# Patient Record
Sex: Female | Born: 1985 | Race: Black or African American | Hispanic: No | Marital: Single | Smoking: Former smoker
Health system: Southern US, Community
[De-identification: ages and names within clinical notes are randomized; demographics above are authoritative.]

## PROBLEM LIST (undated history)

## (undated) ENCOUNTER — Inpatient Hospital Stay (HOSPITAL_COMMUNITY): Payer: Self-pay

## (undated) DIAGNOSIS — B999 Unspecified infectious disease: Secondary | ICD-10-CM

## (undated) DIAGNOSIS — D649 Anemia, unspecified: Secondary | ICD-10-CM

## (undated) DIAGNOSIS — Z789 Other specified health status: Secondary | ICD-10-CM

## (undated) DIAGNOSIS — IMO0002 Reserved for concepts with insufficient information to code with codable children: Secondary | ICD-10-CM

## (undated) DIAGNOSIS — A749 Chlamydial infection, unspecified: Secondary | ICD-10-CM

## (undated) DIAGNOSIS — I10 Essential (primary) hypertension: Secondary | ICD-10-CM

## (undated) HISTORY — PX: NO PAST SURGERIES: SHX2092

## (undated) HISTORY — DX: Chlamydial infection, unspecified: A74.9

## (undated) HISTORY — DX: Essential (primary) hypertension: I10

## (undated) HISTORY — DX: Reserved for concepts with insufficient information to code with codable children: IMO0002

## (undated) HISTORY — DX: Anemia, unspecified: D64.9

## (undated) HISTORY — DX: Unspecified infectious disease: B99.9

---
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
## 2004-09-26 ENCOUNTER — Emergency Department (HOSPITAL_COMMUNITY): Admission: EM | Admit: 2004-09-26 | Discharge: 2004-09-26 | Payer: Self-pay | Admitting: Family Medicine

## 2006-07-13 ENCOUNTER — Inpatient Hospital Stay (HOSPITAL_COMMUNITY): Admission: AD | Admit: 2006-07-13 | Discharge: 2006-07-13 | Payer: Self-pay | Admitting: Obstetrics & Gynecology

## 2006-07-17 ENCOUNTER — Inpatient Hospital Stay (HOSPITAL_COMMUNITY): Admission: AD | Admit: 2006-07-17 | Discharge: 2006-07-17 | Payer: Self-pay | Admitting: Obstetrics and Gynecology

## 2006-10-03 ENCOUNTER — Ambulatory Visit (HOSPITAL_COMMUNITY): Admission: RE | Admit: 2006-10-03 | Discharge: 2006-10-03 | Payer: Self-pay | Admitting: Obstetrics & Gynecology

## 2007-01-06 ENCOUNTER — Ambulatory Visit: Payer: Self-pay | Admitting: Physician Assistant

## 2007-01-06 ENCOUNTER — Inpatient Hospital Stay (HOSPITAL_COMMUNITY): Admission: AD | Admit: 2007-01-06 | Discharge: 2007-01-06 | Payer: Self-pay | Admitting: Gynecology

## 2008-04-29 ENCOUNTER — Inpatient Hospital Stay (HOSPITAL_COMMUNITY): Admission: AD | Admit: 2008-04-29 | Discharge: 2008-04-29 | Payer: Self-pay | Admitting: Family Medicine

## 2009-06-27 ENCOUNTER — Emergency Department (HOSPITAL_COMMUNITY): Admission: EM | Admit: 2009-06-27 | Discharge: 2009-06-27 | Payer: Self-pay | Admitting: Emergency Medicine

## 2011-02-22 ENCOUNTER — Inpatient Hospital Stay (INDEPENDENT_AMBULATORY_CARE_PROVIDER_SITE_OTHER)
Admission: RE | Admit: 2011-02-22 | Discharge: 2011-02-22 | Disposition: A | Discharge status: Home / Self Care | Payer: Self-pay | Source: Ambulatory Visit | Attending: Family Medicine | Admitting: Family Medicine

## 2011-02-22 DIAGNOSIS — M79609 Pain in unspecified limb: Secondary | ICD-10-CM

## 2011-02-22 LAB — POCT PREGNANCY, URINE: Preg Test, Ur: NEGATIVE

## 2011-03-19 LAB — URINALYSIS, ROUTINE W REFLEX MICROSCOPIC
Bilirubin Urine: NEGATIVE
Ketones, ur: NEGATIVE
Nitrite: NEGATIVE
Protein, ur: NEGATIVE
Urobilinogen, UA: 1
pH: 7.5

## 2011-03-19 LAB — URINE MICROSCOPIC-ADD ON

## 2011-03-19 LAB — POCT PREGNANCY, URINE

## 2011-04-01 LAB — URINALYSIS, ROUTINE W REFLEX MICROSCOPIC
Bilirubin Urine: NEGATIVE
Glucose, UA: NEGATIVE
Nitrite: NEGATIVE
Specific Gravity, Urine: 1.02
pH: 7

## 2011-04-01 LAB — URINE MICROSCOPIC-ADD ON

## 2011-04-01 LAB — WET PREP, GENITAL

## 2011-04-01 LAB — GC/CHLAMYDIA PROBE AMP, GENITAL

## 2012-02-04 ENCOUNTER — Inpatient Hospital Stay (HOSPITAL_COMMUNITY)
Admission: AD | Admit: 2012-02-04 | Discharge: 2012-02-04 | Disposition: A | Discharge status: Home / Self Care | Payer: Self-pay | Source: Ambulatory Visit | Attending: Obstetrics & Gynecology | Admitting: Obstetrics & Gynecology

## 2012-02-04 ENCOUNTER — Emergency Department (HOSPITAL_COMMUNITY)
Admission: EM | Admit: 2012-02-04 | Discharge: 2012-02-04 | Discharge status: Absent without leave | Payer: Self-pay | Source: Home / Self Care

## 2012-02-04 ENCOUNTER — Encounter (HOSPITAL_COMMUNITY): Payer: Self-pay

## 2012-02-04 DIAGNOSIS — Z331 Pregnant state, incidental: Secondary | ICD-10-CM

## 2012-02-04 DIAGNOSIS — Z3202 Encounter for pregnancy test, result negative: Secondary | ICD-10-CM | POA: Insufficient documentation

## 2012-02-04 DIAGNOSIS — Z3490 Encounter for supervision of normal pregnancy, unspecified, unspecified trimester: Secondary | ICD-10-CM

## 2012-02-04 HISTORY — DX: Other specified health status: Z78.9

## 2012-02-04 LAB — POCT PREGNANCY, URINE: Preg Test, Ur: POSITIVE — AB

## 2012-02-04 MED ORDER — PRENATAL PLUS 27-1 MG PO TABS: 1.0000 | ORAL_TABLET | Freq: Every day | ORAL | Status: DC

## 2012-02-04 NOTE — MAU Provider Note (Signed)
Attestation of Attending Supervision of Advanced Practitioner (CNM/NP): Evaluation and management procedures were performed by the Advanced Practitioner under my supervision and collaboration.  I have reviewed the Advanced Practitioner's note and chart, and I agree with the management and plan.  Jaynie Collins, MD, FACOG Attending Obstetrician & Gynecologist Faculty Practice, [facility] [place], [facility]

## 2012-02-04 NOTE — MAU Provider Note (Signed)
Helen Tyler [id number] @[redacted]w[redacted]d  by LMP Chief Complaint  Patient presents with  . Possible Pregnancy    First contact 1650    SUBJECTIVE  HPI: Wants pregnancy confirmation. +HPT. LMP sure. No bleeding or abd pain.   Past Medical History  Diagnosis Date  . No pertinent past medical history    Past Surgical History  Procedure Date  . No past surgeries    History   Social History  . Marital Status: Single    Spouse Name: N/A    Number of Children: N/A  . Years of Education: N/A   Occupational History  . Not on file.   Social History Main Topics  . Smoking status: Current Some Day Smoker  . Smokeless tobacco: Not on file  . Alcohol Use: No  . Drug Use: Yes    Special: Marijuana  . Sexually Active: Yes   Other Topics Concern  . Not on file   Social History Narrative  . No narrative on file   No current facility-administered medications on file prior to encounter.   No current outpatient prescriptions on file prior to encounter.   Allergies not on file  ROS: Pertinent items in HPI  OBJECTIVE Blood pressure 120/68, pulse 84, temperature 99.9 F (37.7 C), temperature source Oral, resp. rate 16, height 5' 5.5" (1.664 m), weight 61.145 kg (134 lb 12.8 oz), last menstrual period 12/14/2011, SpO2 100.00%.  GENERAL: Well-developed, well-nourished female in no acute distress.  HEENT: Normocephalic, good dentition HEART: normal rate RESP: normal effort ABDOMEN: Soft, nontender EXTREMITIES: Nontender, no edema NEURO: Alert and oriented   LAB RESULTS  Results for orders placed during the hospital encounter of 02/04/12 (from the past 24 hour(s))  POCT PREGNANCY, URINE     Status: Abnormal   Collection Time   02/04/12  4:29 PM      Component Value Range   Preg Test, Ur POSITIVE (*) NEGATIVE       ASSESSMENT  1. Pregnancy     PLAN  Medication List  As of 02/04/2012  4:53 PM   TAKE these medications         prenatal vitamin w/FE, FA 27-1 MG Tabs     Take 1 tablet by mouth daily.           Pregnancy precautions, Rx prenatal vitamins, pregnancy verification letter     [facility] 02/04/2012 4:53 PM

## 2012-02-04 NOTE — MAU Note (Signed)
Patient states she had a positive home pregnancy test last night. Wants to have confirmation. Denies any pain or bleeding.

## 2012-02-12 ENCOUNTER — Inpatient Hospital Stay (HOSPITAL_COMMUNITY)
Admission: AD | Admit: 2012-02-12 | Discharge: 2012-02-12 | Disposition: A | Discharge status: Home / Self Care | Payer: Medicaid Other | Source: Ambulatory Visit | Attending: Obstetrics & Gynecology | Admitting: Obstetrics & Gynecology

## 2012-02-12 ENCOUNTER — Inpatient Hospital Stay (HOSPITAL_COMMUNITY): Payer: Medicaid Other

## 2012-02-12 ENCOUNTER — Encounter (HOSPITAL_COMMUNITY): Payer: Self-pay

## 2012-02-12 DIAGNOSIS — R1031 Right lower quadrant pain: Secondary | ICD-10-CM | POA: Insufficient documentation

## 2012-02-12 DIAGNOSIS — Z3490 Encounter for supervision of normal pregnancy, unspecified, unspecified trimester: Secondary | ICD-10-CM

## 2012-02-12 DIAGNOSIS — O99891 Other specified diseases and conditions complicating pregnancy: Secondary | ICD-10-CM | POA: Insufficient documentation

## 2012-02-12 DIAGNOSIS — Z331 Pregnant state, incidental: Secondary | ICD-10-CM

## 2012-02-12 LAB — CBC
HCT: 33 % — ABNORMAL LOW (ref 36.0–46.0)
Hemoglobin: 11 g/dL — ABNORMAL LOW (ref 12.0–15.0)
MCH: 27.2 pg (ref 26.0–34.0)
MCHC: 33.3 g/dL (ref 30.0–36.0)

## 2012-02-12 LAB — WET PREP, GENITAL
Trich, Wet Prep: NONE SEEN
Yeast Wet Prep HPF POC: NONE SEEN

## 2012-02-12 LAB — URINALYSIS, ROUTINE W REFLEX MICROSCOPIC
Bilirubin Urine: NEGATIVE
Glucose, UA: NEGATIVE mg/dL
Ketones, ur: NEGATIVE mg/dL
Leukocytes, UA: NEGATIVE
Nitrite: NEGATIVE
Protein, ur: NEGATIVE mg/dL
Specific Gravity, Urine: 1.025 (ref 1.005–1.030)
Urobilinogen, UA: 2 mg/dL — ABNORMAL HIGH (ref 0.0–1.0)
pH: 6.5 (ref 5.0–8.0)

## 2012-02-12 LAB — URINE MICROSCOPIC-ADD ON

## 2012-02-12 NOTE — MAU Note (Signed)
Got bad last night, keep having sharp pain in bottom of stomach- mainly on right side- but it moves- back and forth.

## 2012-02-12 NOTE — MAU Provider Note (Signed)
History     CSN: [id number]  Arrival date and time: 02/12/12 1044   None     Chief Complaint  Patient presents with  . Abdominal Pain   HPI This is a 26 y.o. female at [redacted]w[redacted]d who presents with c/o RLQ pain and bleeding when she wipes. Denies fever or Nausea, but reports decreased appetite.  OB History    Grav Para Term Preterm Abortions TAB SAB Ect Mult Living   4 1 1  2 2    1       Past Medical History  Diagnosis Date  . No pertinent past medical history     Past Surgical History  Procedure Date  . No past surgeries     Family History  Problem Relation Age of Onset  . Other Neg Hx   . Hypertension Mother     History  Substance Use Topics  . Smoking status: Former Games developer  . Smokeless tobacco: Not on file  . Alcohol Use: No    Allergies: No Known Allergies  No prescriptions prior to admission    ROS See HPI  Physical Exam   Blood pressure 114/65, pulse 73, temperature 99.4 F (37.4 C), temperature source Oral, resp. rate 18, height 5\' 5"  (1.651 m), weight 132 lb (59.875 kg), last menstrual period 12/14/2011.  Physical Exam  Constitutional: She is oriented to person, place, and time. She appears well-developed and well-nourished. No distress.  Cardiovascular: Normal rate.   Respiratory: Effort normal.  GI: Soft. She exhibits no distension and no mass. There is tenderness (Mild, RLQ). There is no rebound and no guarding.  Genitourinary: Vagina normal and uterus normal. No vaginal discharge (No blood in vault) found.  Musculoskeletal: Normal range of motion.  Neurological: She is alert and oriented to person, place, and time.  Skin: Skin is warm and dry.  Psychiatric: She has a normal mood and affect.   Results for orders placed during the hospital encounter of 02/12/12 (from the past 24 hour(s))  URINALYSIS, ROUTINE W REFLEX MICROSCOPIC     Status: Abnormal   Collection Time   02/12/12 11:10 AM      Component Value Range   Color, Urine YELLOW   YELLOW   APPearance CLEAR  CLEAR   Specific Gravity, Urine 1.025  1.005 - 1.030   pH 6.5  5.0 - 8.0   Glucose, UA NEGATIVE  NEGATIVE mg/dL   Hgb urine dipstick TRACE (*) NEGATIVE   Bilirubin Urine NEGATIVE  NEGATIVE   Ketones, ur NEGATIVE  NEGATIVE mg/dL   Protein, ur NEGATIVE  NEGATIVE mg/dL   Urobilinogen, UA 2.0 (*) 0.0 - 1.0 mg/dL   Nitrite NEGATIVE  NEGATIVE   Leukocytes, UA NEGATIVE  NEGATIVE  URINE MICROSCOPIC-ADD ON     Status: Abnormal   Collection Time   02/12/12 11:10 AM      Component Value Range   Squamous Epithelial / LPF RARE  RARE   WBC, UA 0-2  <3 WBC/hpf   RBC / HPF 0-2  <3 RBC/hpf   Bacteria, UA FEW (*) RARE   Urine-Other MUCOUS PRESENT    WET PREP, GENITAL     Status: Abnormal   Collection Time   02/12/12 11:20 AM      Component Value Range   Yeast Wet Prep HPF POC NONE SEEN  NONE SEEN   Trich, Wet Prep NONE SEEN  NONE SEEN   Clue Cells Wet Prep HPF POC FEW (*) NONE SEEN   WBC, Wet Prep HPF POC  FEW (*) NONE SEEN  HCG, QUANTITATIVE, PREGNANCY     Status: Abnormal   Collection Time   02/12/12 11:40 AM      Component Value Range   hCG, Beta Chain, Sharene Butters, [place] [address] (*) <5 mIU/mL  CBC     Status: Abnormal   Collection Time   02/12/12 11:40 AM      Component Value Range   WBC 5.3  4.0 - 10.5 K/uL   RBC 4.04  3.87 - 5.11 MIL/uL   Hemoglobin 11.0 (*) 12.0 - 15.0 g/dL   HCT 60.4 (*) 54.0 - 98.1 %   MCV 81.7  78.0 - 100.0 fL   MCH 27.2  26.0 - 34.0 pg   MCHC 33.3  30.0 - 36.0 g/dL   RDW 19.1  47.8 - 29.5 %   Platelets 226  150 - 400 K/uL    MAU Course  Procedures  MDM Will check cultures, Quant and [place]. >> EDC 10/08/12 at 5.6 weeks with Yolk Sac seen. No embryo seen.   Assessment and Plan  A:  SIUP at [redacted]w[redacted]d by LMP, 5.6wks by [place]      Yolk sac seen  P:  Discharge      Start prenatal care  [facility] 02/12/2012, 11:35 AM

## 2012-02-12 NOTE — MAU Provider Note (Signed)
Attestation of Attending Supervision of Advanced Practitioner (CNM/NP): Evaluation and management procedures were performed by the Advanced Practitioner under my supervision and collaboration.  I have reviewed the Advanced Practitioner's note and chart, and I agree with the management and plan.  Tip Atienza, MD, FACOG Attending Obstetrician & Gynecologist Faculty Practice, [facility]  

## 2012-02-20 ENCOUNTER — Encounter (HOSPITAL_COMMUNITY): Payer: Self-pay | Admitting: *Deleted

## 2012-02-20 ENCOUNTER — Inpatient Hospital Stay (HOSPITAL_COMMUNITY)
Admission: AD | Admit: 2012-02-20 | Discharge: 2012-02-20 | Disposition: A | Discharge status: Home / Self Care | Payer: Medicaid Other | Source: Ambulatory Visit | Attending: Obstetrics & Gynecology | Admitting: Obstetrics & Gynecology

## 2012-02-20 DIAGNOSIS — R109 Unspecified abdominal pain: Secondary | ICD-10-CM | POA: Insufficient documentation

## 2012-02-20 DIAGNOSIS — O210 Mild hyperemesis gravidarum: Secondary | ICD-10-CM | POA: Insufficient documentation

## 2012-02-20 DIAGNOSIS — A499 Bacterial infection, unspecified: Secondary | ICD-10-CM | POA: Insufficient documentation

## 2012-02-20 DIAGNOSIS — B9689 Other specified bacterial agents as the cause of diseases classified elsewhere: Secondary | ICD-10-CM | POA: Insufficient documentation

## 2012-02-20 DIAGNOSIS — N760 Acute vaginitis: Secondary | ICD-10-CM | POA: Insufficient documentation

## 2012-02-20 DIAGNOSIS — O2390 Unspecified genitourinary tract infection in pregnancy, unspecified trimester: Secondary | ICD-10-CM | POA: Insufficient documentation

## 2012-02-20 DIAGNOSIS — O219 Vomiting of pregnancy, unspecified: Secondary | ICD-10-CM

## 2012-02-20 LAB — WET PREP, GENITAL: Yeast Wet Prep HPF POC: NONE SEEN

## 2012-02-20 LAB — URINALYSIS, ROUTINE W REFLEX MICROSCOPIC
Leukocytes, UA: NEGATIVE
Nitrite: NEGATIVE
Specific Gravity, Urine: 1.025 (ref 1.005–1.030)
pH: 7 (ref 5.0–8.0)

## 2012-02-20 LAB — URINE MICROSCOPIC-ADD ON

## 2012-02-20 MED ORDER — PROMETHAZINE HCL 25 MG PO TABS: 25.0000 mg | ORAL_TABLET | Freq: Four times a day (QID) | ORAL | Status: DC | PRN

## 2012-02-20 MED ORDER — ONDANSETRON 8 MG PO TBDP: 8.0000 mg | ORAL_TABLET | Freq: Three times a day (TID) | ORAL | Status: AC | PRN

## 2012-02-20 MED ORDER — ONDANSETRON 8 MG PO TBDP
8.0000 mg | ORAL_TABLET | Freq: Once | ORAL | Status: AC
  Administered 2012-02-20: 8 mg via ORAL
  Filled 2012-02-20: qty 1

## 2012-02-20 MED ORDER — DOCUSATE SODIUM 100 MG PO CAPS: 100.0000 mg | ORAL_CAPSULE | Freq: Two times a day (BID) | ORAL | Status: AC

## 2012-02-20 NOTE — MAU Provider Note (Signed)
History     CSN: [id number]  Arrival date and time: 02/20/12 1451   First Provider Initiated Contact with Patient 02/20/12 1633      Chief Complaint  Patient presents with  . Emesis During Pregnancy  . Vaginal Discharge   HPI Pt is 7w pregnant and complains of nausea and vomiting with pregnancy, unable to keep any food or fluids down for 24 hours except for oatmeal this morning.  Pt was just seen on 8/29 with wet prep and GC/chlamydia and confirmation of pregnancy, with abdominal pain, which has resolved. Past Medical History  Diagnosis Date  . No pertinent past medical history     Past Surgical History  Procedure Date  . No past surgeries     Family History  Problem Relation Age of Onset  . Other Neg Hx   . Hypertension Mother     History  Substance Use Topics  . Smoking status: Former Games developer  . Smokeless tobacco: Not on file  . Alcohol Use: No    Allergies: No Known Allergies  Prescriptions prior to admission  Medication Sig Dispense Refill  . acetaminophen (TYLENOL) 500 MG tablet Take 500 mg by mouth every 6 (six) hours as needed. For pain or cold symptoms        Review of Systems  Gastrointestinal: Positive for nausea, vomiting and constipation. Negative for abdominal pain and diarrhea.  Genitourinary: Negative for dysuria, urgency and frequency.   Physical Exam   Blood pressure 121/73, pulse 77, temperature 99.3 F (37.4 C), temperature source Oral, resp. rate 16, height 5\' 5"  (1.651 m), weight 59.24 kg (130 lb 9.6 oz), last menstrual period 12/14/2011, SpO2 100.00%.  Physical Exam  Vitals reviewed. Constitutional: She is oriented to person, place, and time. She appears well-developed and well-nourished.  HENT:  Head: Normocephalic.  Eyes: Pupils are equal, round, and reactive to light.  Neck: Normal range of motion. Neck supple.  Cardiovascular: Normal rate.   Respiratory: Effort normal.  GI: Soft. She exhibits no distension. There is no  tenderness. There is no rebound and no guarding.  Genitourinary:       Blind swab for wet prep- pt was just here for exam  Musculoskeletal: Normal range of motion.  Neurological: She is alert and oriented to person, place, and time.  Skin: Skin is warm and dry.  Psychiatric: She has a normal mood and affect.    MAU Course  Procedures Results for orders placed during the hospital encounter of 02/20/12 (from the past 24 hour(s))  URINALYSIS, ROUTINE W REFLEX MICROSCOPIC     Status: Abnormal   Collection Time   02/20/12  3:31 PM      Component Value Range   Color, Urine YELLOW  YELLOW   APPearance HAZY (*) CLEAR   Specific Gravity, Urine 1.025  1.005 - 1.030   pH 7.0  5.0 - 8.0   Glucose, UA NEGATIVE  NEGATIVE mg/dL   Hgb urine dipstick TRACE (*) NEGATIVE   Bilirubin Urine NEGATIVE  NEGATIVE   Ketones, ur NEGATIVE  NEGATIVE mg/dL   Protein, ur NEGATIVE  NEGATIVE mg/dL   Urobilinogen, UA 1.0  0.0 - 1.0 mg/dL   Nitrite NEGATIVE  NEGATIVE   Leukocytes, UA NEGATIVE  NEGATIVE  URINE MICROSCOPIC-ADD ON     Status: Abnormal   Collection Time   02/20/12  3:31 PM      Component Value Range   Squamous Epithelial / LPF FEW (*) RARE   WBC, UA 0-2  <3 WBC/hpf  RBC / HPF 3-6  <3 RBC/hpf   Bacteria, UA FEW (*) RARE   Urine-Other MUCOUS PRESENT    WET PREP, GENITAL     Status: Abnormal   Collection Time   02/20/12  5:05 PM      Component Value Range   Yeast Wet Prep HPF POC NONE SEEN  NONE SEEN   Trich, Wet Prep NONE SEEN  NONE SEEN   Clue Cells Wet Prep HPF POC FEW (*) NONE SEEN   WBC, Wet Prep HPF POC FEW (*) NONE SEEN  Zofran 8mg  ODT given for nausea and pt able to tolerated oral fluids  Assessment and Plan  Nausea and vomiting in pregnancy- prescription for Zofran 8mg  ODT and phenergan 25mg  Bacterial vaginitis- prescription for Flagyl 500mg  BID for 7 days F/u with OB care  Helen Tyler 02/20/2012, 5:57 PM

## 2012-02-20 NOTE — MAU Note (Signed)
Patient states she has had nausea and vomiting for about 4-5 days, not able to keep anything down. Has a clear vaginal discharge with odor. No bleeding or pain.

## 2012-03-04 ENCOUNTER — Inpatient Hospital Stay (HOSPITAL_COMMUNITY)
Admission: AD | Admit: 2012-03-04 | Discharge: 2012-03-04 | Disposition: A | Discharge status: Home / Self Care | Payer: Medicaid Other | Source: Ambulatory Visit | Attending: Obstetrics & Gynecology | Admitting: Obstetrics & Gynecology

## 2012-03-04 ENCOUNTER — Encounter (HOSPITAL_COMMUNITY): Payer: Self-pay | Admitting: *Deleted

## 2012-03-04 DIAGNOSIS — R109 Unspecified abdominal pain: Secondary | ICD-10-CM | POA: Insufficient documentation

## 2012-03-04 DIAGNOSIS — N949 Unspecified condition associated with female genital organs and menstrual cycle: Secondary | ICD-10-CM | POA: Insufficient documentation

## 2012-03-04 DIAGNOSIS — A499 Bacterial infection, unspecified: Secondary | ICD-10-CM

## 2012-03-04 DIAGNOSIS — O2390 Unspecified genitourinary tract infection in pregnancy, unspecified trimester: Secondary | ICD-10-CM | POA: Insufficient documentation

## 2012-03-04 DIAGNOSIS — B9689 Other specified bacterial agents as the cause of diseases classified elsewhere: Secondary | ICD-10-CM | POA: Insufficient documentation

## 2012-03-04 DIAGNOSIS — Z3490 Encounter for supervision of normal pregnancy, unspecified, unspecified trimester: Secondary | ICD-10-CM

## 2012-03-04 DIAGNOSIS — N760 Acute vaginitis: Secondary | ICD-10-CM

## 2012-03-04 LAB — URINALYSIS, ROUTINE W REFLEX MICROSCOPIC
Bilirubin Urine: NEGATIVE
Glucose, UA: NEGATIVE mg/dL
Ketones, ur: NEGATIVE mg/dL
Nitrite: NEGATIVE
Specific Gravity, Urine: >1.03 — ABNORMAL HIGH (ref 1.005–1.030)
pH: 6 (ref 5.0–8.0)

## 2012-03-04 LAB — WET PREP, GENITAL: Trich, Wet Prep: NONE SEEN

## 2012-03-04 LAB — URINE MICROSCOPIC-ADD ON

## 2012-03-04 MED ORDER — METRONIDAZOLE 500 MG PO TABS: 500.0000 mg | ORAL_TABLET | Freq: Two times a day (BID) | ORAL | Status: DC

## 2012-03-04 MED ORDER — PROMETHAZINE HCL 25 MG PO TABS: 25.0000 mg | ORAL_TABLET | Freq: Four times a day (QID) | ORAL | Status: DC | PRN

## 2012-03-04 MED ORDER — PRENATAL VITAMINS (DIS) PO TABS: 1.0000 | ORAL_TABLET | Freq: Every day | ORAL | Status: DC

## 2012-03-04 NOTE — MAU Provider Note (Signed)
Chief Complaint:  Vaginal Discharge and Abdominal Pain    First Provider Initiated Contact with Patient 03/04/12 1529      Helen Tyler is  26 y.o. [id number].  Patient's last menstrual period was 12/14/2011.Marland Kitchen  Her pregnancy status is positive.[redacted]w[redacted]d by LMP  She presents complaining of Vaginal Discharge and Abdominal Pain . Onset is described as ongoing and has been present for  1 weeks. Reports intermittent lower abd cramping. Was told she has a mild case of BV at last visit but was not treated. States vomiting improving, none in 3 days.  Obstetrical/Gynecological History: OB History    Grav Para Term Preterm Abortions TAB SAB Ect Mult Living   4 1 1  2 2    1       Past Medical History: Past Medical History  Diagnosis Date  . No pertinent past medical history     Past Surgical History: Past Surgical History  Procedure Date  . No past surgeries     Family History: Family History  Problem Relation Age of Onset  . Other Neg Hx   . Hypertension Mother     Social History: History  Substance Use Topics  . Smoking status: Former Games developer  . Smokeless tobacco: Not on file  . Alcohol Use: No    Allergies: No Known Allergies  Prescriptions prior to admission  Medication Sig Dispense Refill  . acetaminophen (TYLENOL) 500 MG tablet Take 500 mg by mouth every 6 (six) hours as needed. For pain or cold symptoms      . promethazine (PHENERGAN) 25 MG tablet Take 1 tablet (25 mg total) by mouth every 6 (six) hours as needed for nausea.  30 tablet  0    Review of Systems - History obtained from chart review and the patient General ROS: negative for - chills, fatigue or fever Breast ROS: positive for - tenderness Respiratory ROS: no cough, shortness of breath, or wheezing Cardiovascular ROS: no chest pain or dyspnea on exertion Gastrointestinal ROS: positive for - abd cramping negative for - diarrhea or nausea/vomiting Genito-Urinary ROS: no dysuria, trouble voiding, or  hematuria positive for - genital discharge and vulvar/vaginal symptoms  Physical Exam   Blood pressure 126/79, pulse 81, temperature 99.3 F (37.4 C), temperature source Oral, resp. rate 16, height 5\' 5"  (1.651 m), weight 126 lb 12.8 oz (57.516 kg), last menstrual period 12/14/2011, SpO2 100.00%.  General: General appearance - alert, well appearing, and in no distress, oriented to person, place, and time and normal appearing weight Mental status - alert, oriented to person, place, and time, normal mood, behavior, speech, dress, motor activity, and thought processes, affect appropriate to mood Abdomen - soft, nontender, nondistended, no masses or organomegaly no rebound tenderness noted no bladder distension noted no CVA tenderness Extremities - peripheral pulses normal, no pedal edema, no clubbing or cyanosis Skin - normal coloration and turgor, no rashes, no suspicious skin lesions noted Focused Gynecological Exam: VULVA: normal appearing vulva with no masses, tenderness or lesions, VAGINA: vaginal discharge - scant and creamy, brown, CERVIX: friable to touch, UTERUS: enlarged to 8 week's size, ADNEXA: normal adnexa in size, nontender and no masses  Labs: Recent Results (from the past 24 hour(s))  URINALYSIS, ROUTINE W REFLEX MICROSCOPIC   Collection Time   03/04/12  3:15 PM      Component Value Range   Color, Urine YELLOW  YELLOW   APPearance HAZY (*) CLEAR   Specific Gravity, Urine >1.030 (*) 1.005 - 1.030   pH 6.0  5.0 - 8.0   Glucose, UA NEGATIVE  NEGATIVE mg/dL   Hgb urine dipstick MODERATE (*) NEGATIVE   Bilirubin Urine NEGATIVE  NEGATIVE   Ketones, ur NEGATIVE  NEGATIVE mg/dL   Protein, ur NEGATIVE  NEGATIVE mg/dL   Urobilinogen, UA 0.2  0.0 - 1.0 mg/dL   Nitrite NEGATIVE  NEGATIVE   Leukocytes, UA NEGATIVE  NEGATIVE  URINE MICROSCOPIC-ADD ON   Collection Time   03/04/12  3:15 PM      Component Value Range   Squamous Epithelial / LPF FEW (*) RARE   RBC / HPF 0-2  <3  RBC/hpf   Urine-Other MUCOUS PRESENT    WET PREP, GENITAL   Collection Time   03/04/12  3:42 PM      Component Value Range   Yeast Wet Prep HPF POC NONE SEEN  NONE SEEN   Trich, Wet Prep NONE SEEN  NONE SEEN   Clue Cells Wet Prep HPF POC FEW (*) NONE SEEN   WBC, Wet Prep HPF POC FEW (*) NONE SEEN   Imaging Studies:  Bedside US reveals viable IUP with CRL measuring [redacted]w[redacted]d. + Cardiac activity   Assessment: Bacterial Vaginosis  Viable IUP  Plan: Discharge home Rx Flagyl, phenergan sent to pharmacy FU with [facility] as scheduled  Anais Koenen E. 03/04/2012,4:12 PM

## 2012-03-04 NOTE — MAU Note (Signed)
Patient states she has had a vaginal discharge for about one week, now looks like blood in it. Patient is not wearing a pad at this time. Has been having lower abdominal cramping for about 3 days.

## 2012-03-06 NOTE — MAU Provider Note (Signed)
Attestation of Attending Supervision of Advanced Practitioner (CNM/NP): Evaluation and management procedures were performed by the Advanced Practitioner under my supervision and collaboration.  I have reviewed the Advanced Practitioner's note and chart, and I agree with the management and plan.  HARRAWAY-SMITH, Gradie Ohm 11:43 AM     

## 2012-03-10 ENCOUNTER — Inpatient Hospital Stay (HOSPITAL_COMMUNITY): Payer: Medicaid Other

## 2012-03-10 ENCOUNTER — Inpatient Hospital Stay (HOSPITAL_COMMUNITY)
Admission: AD | Admit: 2012-03-10 | Discharge: 2012-03-11 | Disposition: A | Discharge status: Home / Self Care | Payer: Medicaid Other | Source: Ambulatory Visit | Attending: Obstetrics and Gynecology | Admitting: Obstetrics and Gynecology

## 2012-03-10 ENCOUNTER — Encounter (HOSPITAL_COMMUNITY): Payer: Self-pay | Admitting: *Deleted

## 2012-03-10 DIAGNOSIS — O9989 Other specified diseases and conditions complicating pregnancy, childbirth and the puerperium: Secondary | ICD-10-CM | POA: Insufficient documentation

## 2012-03-10 DIAGNOSIS — O2390 Unspecified genitourinary tract infection in pregnancy, unspecified trimester: Secondary | ICD-10-CM | POA: Insufficient documentation

## 2012-03-10 DIAGNOSIS — B9689 Other specified bacterial agents as the cause of diseases classified elsewhere: Secondary | ICD-10-CM | POA: Insufficient documentation

## 2012-03-10 DIAGNOSIS — O209 Hemorrhage in early pregnancy, unspecified: Secondary | ICD-10-CM | POA: Insufficient documentation

## 2012-03-10 DIAGNOSIS — A499 Bacterial infection, unspecified: Secondary | ICD-10-CM | POA: Insufficient documentation

## 2012-03-10 DIAGNOSIS — K117 Disturbances of salivary secretion: Secondary | ICD-10-CM | POA: Insufficient documentation

## 2012-03-10 DIAGNOSIS — N760 Acute vaginitis: Secondary | ICD-10-CM | POA: Insufficient documentation

## 2012-03-10 NOTE — MAU Note (Signed)
Pt states she has been seeing blood since 03/04/2012. Pt states she was told that she was hemmorhaging. Pt states since then bleeding has been on and off.

## 2012-03-10 NOTE — MAU Note (Signed)
Pt G4 P1 at 9.1wks with vag bleeding, passed a small clot in the toilet this evening and having upper abd cramping.

## 2012-03-10 NOTE — Progress Notes (Signed)
Pt states she fell [place] her [place] [employer]

## 2012-03-10 NOTE — Progress Notes (Signed)
Pt states she "spits" a lot

## 2012-03-10 NOTE — Progress Notes (Signed)
Pt states she is having pain in her upper back and it was in her back when she was at work today

## 2012-03-10 NOTE — MAU Provider Note (Signed)
Chief Complaint: Vaginal Bleeding   First Provider Initiated Contact with Patient 03/10/12 2244     SUBJECTIVE HPI: Helen Tyler is a 26 y.o. [id number] at [redacted]w[redacted]d by LMP who presents to maternity admissions reporting on and off vaginal spotting since her last visit to MAU on 03/04/12 with a small clot passed in the toilet earlier today.  She brings a picture on her phone of the blood clot.  She also continues to have vaginal discharge with odor and has been unable to keep down the Flagyl prescribed for her at her last MAU visit.  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  She has applied for Medicaid, which is pending, and has her initial OB appointment to fill out paperwork at the health department tomorrow.  She denies LOF, urinary symptoms, h/a, dizziness, n/v, or fever/chills.     Past Medical History  Diagnosis Date  . No pertinent past medical history    Past Surgical History  Procedure Date  . No past surgeries    History   Social History  . Marital Status: Single    Spouse Name: N/A    Number of Children: N/A  . Years of Education: N/A   Occupational History  . Not on file.   Social History Main Topics  . Smoking status: Former Games developer  . Smokeless tobacco: Not on file  . Alcohol Use: No  . Drug Use: Yes    Special: Marijuana     has not used beginning the month none since  . Sexually Active: Yes   Other Topics Concern  . Not on file   Social History Narrative  . No narrative on file   No current facility-administered medications on file prior to encounter.   Current Outpatient Prescriptions on File Prior to Encounter  Medication Sig Dispense Refill  . metroNIDAZOLE (FLAGYL) 500 MG tablet Take 1 tablet (500 mg total) by mouth 2 (two) times daily.  14 tablet  0  . promethazine (PHENERGAN) 25 MG tablet Take 1 tablet (25 mg total) by mouth every 6 (six) hours as needed for nausea.  30 tablet  0  . Prenatal Vitamins (DIS)  TABS Take 1 tablet by mouth daily.  30 tablet  10  . DISCONTD: promethazine (PHENERGAN) 12.5 MG tablet Take 12.5 mg by mouth every 6 (six) hours as needed. For nausea.       No Known Allergies  ROS: Pertinent items in HPI  OBJECTIVE Blood pressure 119/72, pulse 97, temperature 98.5 F (36.9 C), temperature source Oral, resp. rate 16, height 5\' 5"  (1.651 m), weight 59.058 kg (130 lb 3.2 oz), last menstrual period 12/14/2011. GENERAL: Well-developed, well-nourished female in no acute distress.  HEENT: Normocephalic HEART: normal rate RESP: normal effort ABDOMEN: Soft, non-tender EXTREMITIES: Nontender, no edema NEURO: Alert and oriented SPECULUM EXAM: Deferred--done on 03/04/12  Results for orders placed during the hospital encounter of 03/10/12 (from the past 24 hour(s))  CBC     Status: Abnormal   Collection Time   03/11/12 12:25 AM      Component Value Range   WBC 9.6  4.0 - 10.5 K/uL   RBC 3.97  3.87 - 5.11 MIL/uL   Hemoglobin 10.7 (*) 12.0 - 15.0 g/dL   HCT 16.1 (*) 09.6 - 04.5 %   MCV 80.1  78.0 - 100.0 fL   MCH 27.0  26.0 - 34.0 pg   MCHC 33.6  30.0 - 36.0 g/dL  RDW 13.3  11.5 - 15.5 %   Platelets 274  150 - 400 K/uL  ABO/RH     Status: Normal (Preliminary result)   Collection Time   03/11/12 12:25 AM      Component Value Range   ABO/RH(D) B POS     IMAGING US Ob Transvaginal  03/10/2012  *RADIOLOGY REPORT*  Clinical Data: Evaluate fetal viability.  Bleeding.  Pelvic pain.  TRANSVAGINAL OB ULTRASOUND  Technique:  Transvaginal ultrasound was performed for evaluation of the gestation as well as the maternal uterus and adnexal regions.  Comparison: 02/12/2012.  Findings: Single IUP is identified with yolk sac, embryo and cardiac tube the of 180 beats per minute.  Crown-rump length is 2.56 cm corresponding with 9 weeks 3 days. Estimated date of delivery is 10/10/2012.  There is a small subchorionic hemorrhage.  Physiologic appearance of the ovaries bilaterally.  Corpus luteum  cyst on the right noted. No free fluid.  IMPRESSION: Single viable intrauterine pregnancy with estimated gestational age [redacted] weeks 3 days.  Small subchorionic hemorrhage.   Original Report Authenticated By: Andreas Newport, M.D.      ASSESSMENT 1. Bacterial vaginosis   2. Ptyalism     PLAN Discharge home Metrogel nightly x7 nights (pt may want to wait until Medicaid begins due to cost) Robinul 1g TID PRN spitting F/U as scheduled with [facility] Return to MAU as needed    Medication List     As of 03/10/2012 10:55 PM    ASK your doctor about these medications         metroNIDAZOLE 500 MG tablet   Commonly known as: FLAGYL   Take 1 tablet (500 mg total) by mouth 2 (two) times daily.      Prenatal Vitamins (DIS) Tabs   Take 1 tablet by mouth daily.      promethazine 25 MG tablet   Commonly known as: PHENERGAN   Take 1 tablet (25 mg total) by mouth every 6 (six) hours as needed for nausea.         Sharen Counter Certified Nurse-Midwife 03/10/2012  10:55 PM

## 2012-03-11 DIAGNOSIS — A499 Bacterial infection, unspecified: Secondary | ICD-10-CM

## 2012-03-11 DIAGNOSIS — N760 Acute vaginitis: Secondary | ICD-10-CM

## 2012-03-11 LAB — CBC
HCT: 31.8 % — ABNORMAL LOW (ref 36.0–46.0)
Hemoglobin: 10.7 g/dL — ABNORMAL LOW (ref 12.0–15.0)
MCH: 27 pg (ref 26.0–34.0)
MCHC: 33.6 g/dL (ref 30.0–36.0)
RDW: 13.3 % (ref 11.5–15.5)

## 2012-03-11 LAB — ABO/RH: ABO/RH(D): B POS

## 2012-03-11 MED ORDER — GLYCOPYRROLATE 2 MG PO TABS: 2.0000 mg | ORAL_TABLET | Freq: Three times a day (TID) | ORAL | Status: DC | PRN

## 2012-03-11 MED ORDER — METRONIDAZOLE 0.75 % VA GEL: VAGINAL | Status: DC

## 2012-03-11 NOTE — MAU Provider Note (Signed)
Attestation of Attending Supervision of Advanced Practitioner (CNM/NP): Evaluation and management procedures were performed by the Advanced Practitioner under my supervision and collaboration.  I have reviewed the Advanced Practitioner's note and chart, and I agree with the management and plan.  Zameer Borman 03/11/2012 7:26 AM

## 2012-03-11 NOTE — Progress Notes (Signed)
Pt of plan of care

## 2012-04-08 ENCOUNTER — Other Ambulatory Visit (HOSPITAL_COMMUNITY): Payer: Self-pay | Admitting: *Deleted

## 2012-04-08 DIAGNOSIS — Z3682 Encounter for antenatal screening for nuchal translucency: Secondary | ICD-10-CM

## 2012-04-10 ENCOUNTER — Ambulatory Visit (HOSPITAL_COMMUNITY)
Admission: RE | Admit: 2012-04-10 | Discharge: 2012-04-10 | Disposition: A | Discharge status: Home / Self Care | Payer: Medicaid Other | Source: Ambulatory Visit | Attending: Nurse Practitioner | Admitting: Nurse Practitioner

## 2012-04-10 ENCOUNTER — Other Ambulatory Visit: Payer: Self-pay

## 2012-04-10 ENCOUNTER — Encounter (HOSPITAL_COMMUNITY): Payer: Self-pay

## 2012-04-10 DIAGNOSIS — Z36 Encounter for antenatal screening of mother: Secondary | ICD-10-CM | POA: Insufficient documentation

## 2012-04-10 DIAGNOSIS — Z3682 Encounter for antenatal screening for nuchal translucency: Secondary | ICD-10-CM

## 2012-04-10 NOTE — Progress Notes (Signed)
Ms. Pittsley was seen for ultrasound appointment today.  Please see [facility] report for details.

## 2012-04-24 ENCOUNTER — Ambulatory Visit (INDEPENDENT_AMBULATORY_CARE_PROVIDER_SITE_OTHER): Payer: Medicaid Other | Admitting: Obstetrics and Gynecology

## 2012-04-24 DIAGNOSIS — Z331 Pregnant state, incidental: Secondary | ICD-10-CM

## 2012-04-25 LAB — PRENATAL PANEL VII
Antibody Screen: NEGATIVE
Basophils Absolute: 0 10*3/uL (ref 0.0–0.1)
Basophils Relative: 0 % (ref 0–1)
Eosinophils Absolute: 0.1 10*3/uL (ref 0.0–0.7)
Eosinophils Relative: 1 % (ref 0–5)
HCT: 32.7 % — ABNORMAL LOW (ref 36.0–46.0)
MCHC: 33 g/dL (ref 30.0–36.0)
MCV: 82 fL (ref 78.0–100.0)
Monocytes Absolute: 0.2 10*3/uL (ref 0.1–1.0)
Platelets: 279 10*3/uL (ref 150–400)
RDW: 15.1 % (ref 11.5–15.5)
Rh Type: POSITIVE
WBC: 8.6 10*3/uL (ref 4.0–10.5)

## 2012-04-25 LAB — CULTURE, OB URINE: Organism ID, Bacteria: NO GROWTH

## 2012-04-28 LAB — POCT URINALYSIS DIPSTICK
Bilirubin, UA: NEGATIVE
Blood, UA: 1
Glucose, UA: NEGATIVE
Ketones, UA: NEGATIVE
Nitrite, UA: NEGATIVE
Spec Grav, UA: 1.015

## 2012-04-28 LAB — HEMOGLOBINOPATHY EVALUATION
Hgb A2 Quant: 2.4 % (ref 2.2–3.2)
Hgb A: 97.6 % (ref 96.8–97.8)
Hgb F Quant: 0 % (ref 0.0–2.0)
Hgb S Quant: 0 %

## 2012-04-30 ENCOUNTER — Other Ambulatory Visit (HOSPITAL_COMMUNITY): Payer: Self-pay | Admitting: Gynecology

## 2012-04-30 DIAGNOSIS — Z3689 Encounter for other specified antenatal screening: Secondary | ICD-10-CM

## 2012-05-13 ENCOUNTER — Encounter: Payer: Self-pay | Admitting: Obstetrics and Gynecology

## 2012-05-13 ENCOUNTER — Ambulatory Visit (HOSPITAL_COMMUNITY)
Admission: RE | Admit: 2012-05-13 | Discharge: 2012-05-13 | Disposition: A | Discharge status: Home / Self Care | Payer: Medicaid Other | Source: Ambulatory Visit | Attending: Gynecology | Admitting: Gynecology

## 2012-05-13 ENCOUNTER — Ambulatory Visit (INDEPENDENT_AMBULATORY_CARE_PROVIDER_SITE_OTHER): Payer: Medicaid Other | Admitting: Obstetrics and Gynecology

## 2012-05-13 VITALS — BP 102/60 | Wt 144.0 lb

## 2012-05-13 DIAGNOSIS — Z331 Pregnant state, incidental: Secondary | ICD-10-CM

## 2012-05-13 DIAGNOSIS — A499 Bacterial infection, unspecified: Secondary | ICD-10-CM

## 2012-05-13 DIAGNOSIS — Z1389 Encounter for screening for other disorder: Secondary | ICD-10-CM | POA: Insufficient documentation

## 2012-05-13 DIAGNOSIS — N760 Acute vaginitis: Secondary | ICD-10-CM

## 2012-05-13 DIAGNOSIS — Z3689 Encounter for other specified antenatal screening: Secondary | ICD-10-CM

## 2012-05-13 DIAGNOSIS — O358XX0 Maternal care for other (suspected) fetal abnormality and damage, not applicable or unspecified: Secondary | ICD-10-CM | POA: Insufficient documentation

## 2012-05-13 DIAGNOSIS — Z363 Encounter for antenatal screening for malformations: Secondary | ICD-10-CM | POA: Insufficient documentation

## 2012-05-13 LAB — POCT WET PREP (WET MOUNT)
Bacteria Wet Prep HPF POC: NEGATIVE
Clue Cells Wet Prep Whiff POC: NEGATIVE
WBC, Wet Prep HPF POC: NEGATIVE
pH: 4.5

## 2012-05-13 NOTE — Progress Notes (Signed)
[facility]-GYN NEW OB EXAMINATION   Helen Tyler is a 26 y.o. female, [id number], who presents at [redacted]w[redacted]d gestation for a new obstetrical examination. The patient has been seen on multiple occasions in the emergency department at [facility]. She had first trimester bleeding that has now resolved.  Her last menstrual period was not completely normal.  The following portions of the patient's history were reviewed and updated as appropriate: allergies, current medications, past family history, past medical history, past social history, past surgical history and problem list.  OB History    Grav Para Term Preterm Abortions TAB SAB Ect Mult Living   4 1 1  2 2    1       Past Medical History  Diagnosis Date  . No pertinent past medical history   . Abnormal Pap smear 2002    Laser procedure done;Last pap 03/2012 was normal  . Infection     BV;not frequent;last dx'd 03/2012 was treated w/ Metrogel  . Infection     Yeast;not frequent  . Chlamydia 2001    was treated    Past Surgical History  Procedure Date  . No past surgeries     Family History  Problem Relation Age of Onset  . Other Neg Hx   . Hypertension Mother   . Hypertension Maternal Grandmother   . Cancer Maternal Grandmother     Stomach  . Congestive Heart Failure Maternal Grandmother   . Mental illness Father     Social History:  reports that she quit smoking about 3 months ago. She has never used smokeless tobacco. She reports that she uses illicit drugs (Marijuana). She reports that she does not drink alcohol.  Allergies: No Known Allergies  Medications: I have reviewed the patient's current medications.   Objective:    BP 102/60  Wt 144 lb (65.318 kg)  LMP 12/14/2011    Weight:  Wt Readings from Last 1 Encounters:  05/13/12 144 lb (65.318 kg)          BMI: There is no height on file to calculate BMI.  General Appearance: Alert, appropriate appearance for age. No acute distress HEENT: Grossly normal Neck  / Thyroid: Supple, no masses, nodes or enlargement Lungs: clear to auscultation bilaterally Back: No CVA tenderness Breast Exam: No masses or nodes.No dimpling, nipple retraction or discharge. Cardiovascular: Regular rate and rhythm. S1, S2, no murmur Gastrointestinal: Soft, non-tender, no masses or organomegaly.                               Fundal height: 18 weeks                               ++++++++++++++++++++++++++++++++++++++++++++++++++++++++  Pelvic Exam: External genitalia: normal general appearance Vaginal: normal without tenderness, induration or masses and relaxation: No Cervix: normal appearance Adnexa: normal bimanual exam Uterus: gravid, nontender, 18 weeks size  ++++++++++++++++++++++++++++++++++++++++++++++++++++++++  Lymphatic Exam: Non-palpable nodes in neck, clavicular, axillary, or inguinal regions Neurologic: Normal speech, no tremor  Psychiatric: Alert and oriented, appropriate affect.  Prenatal labs: ABO, Rh: B/POS/-- (11/08 1038) Antibody: NEG (11/08 1038) Rubella:  immune RPR: NON REAC (11/08 1038)  HBsAg: NEGATIVE (11/08 1038)  HIV: NON REACTIVE (11/08 1038)  GBS:   pending until the third trimester Wet prep: Vaginosis (treated with MetroGel) Gonorrhea: Negative Chlamydia: Negative Urine culture: Negative Hemoglobin: 10.8 First trimester screening: Normal Anatomy scan today:  Single gestation, normal fluid, cervix 3.75 cm, 18 weeks and 4 days, anatomy normal except the spine and the lips were not seen.   Wet Prep:   Previously done:            yes                     If no: Whiff:                     Negative                              Clue cells:             no                              PH:                        4.5                              Yeast:                    no                              Trichomoniasis:    no    Assessment:   26 y.o. female [id number] at [redacted]w[redacted]d gestation ( EDC is October 08, 2012) by: Normal Last menstrual  period: no Ultrasound:                               September 24 at 9 weeks and 3 days gestation      Late prenatal care  First trimester bleeding now resolved  Incomplete anatomy survey on ultrasound  Recurrent bacterial vaginosis: Now resolved after treatment earlier in pregnancy.                            Plan:    We discussed routine pregnancy issues:  Toxoplasmosis was reviewed.  The patient was told to avoid cat liter boxes and feces.  The patient was told to avoid predator fish including tuna because of our concerns for mercury consumption.  The patient was told to avoid soft cheeses.  The patient was told to be sure that all lunch meats are well cooked.  Our model for pregnancy management was reviewed.  Proper diet and exercise reviewed.  Return to office in 4 weeks.  Medications include:  Prenatal vitamins Discuss alpha-fetoprotein screen next visit  Complete anatomy scan in 4-6 weeks to look at the spine and the lips.  Mylinda Latina.D.

## 2012-05-13 NOTE — Progress Notes (Signed)
[redacted]w[redacted]d  Pt has no concerns  Last pap 03/2012 pt

## 2012-05-15 ENCOUNTER — Ambulatory Visit (HOSPITAL_COMMUNITY): Payer: Medicaid Other

## 2012-05-26 ENCOUNTER — Other Ambulatory Visit (HOSPITAL_COMMUNITY): Payer: Self-pay | Admitting: Physician Assistant

## 2012-05-26 DIAGNOSIS — Z3689 Encounter for other specified antenatal screening: Secondary | ICD-10-CM

## 2012-06-03 ENCOUNTER — Ambulatory Visit (HOSPITAL_COMMUNITY)
Admission: RE | Admit: 2012-06-03 | Discharge: 2012-06-03 | Disposition: A | Discharge status: Home / Self Care | Payer: Medicaid Other | Source: Ambulatory Visit | Attending: Physician Assistant | Admitting: Physician Assistant

## 2012-06-03 DIAGNOSIS — Z3689 Encounter for other specified antenatal screening: Secondary | ICD-10-CM | POA: Insufficient documentation

## 2012-06-09 ENCOUNTER — Ambulatory Visit (INDEPENDENT_AMBULATORY_CARE_PROVIDER_SITE_OTHER): Payer: Medicaid Other | Admitting: Obstetrics and Gynecology

## 2012-06-09 ENCOUNTER — Encounter: Payer: Self-pay | Admitting: Obstetrics and Gynecology

## 2012-06-09 VITALS — BP 100/62 | Wt 152.0 lb

## 2012-06-09 DIAGNOSIS — Z331 Pregnant state, incidental: Secondary | ICD-10-CM

## 2012-06-09 NOTE — Progress Notes (Signed)
[redacted]w[redacted]d GFM

## 2012-06-09 NOTE — Progress Notes (Signed)
[redacted]w[redacted]d Pt has no complaints

## 2012-06-17 NOTE — L&D Delivery Note (Signed)
Helen Tyler is a [redacted]w[redacted]d [id number] presenting in active labor. She was augmented with pitocin and progressed to complete dilation. She pushed for 3-4 contractions.  Delivery Note At 6:52 PM a viable female was delivered via Vaginal, Spontaneous Delivery (Presentation: Left Occiput Anterior).  APGAR: 9, 9; weight pending.   Placenta status: Intact, Spontaneous.  Cord: 3 vessels with the following complications: Knot.  Cord pH: n/a  Anesthesia: Epidural  Episiotomy: None Lacerations: None Suture Repair: n/a Est. Blood Loss (mL): 200  Mom to postpartum.  Baby to nursery-stable.  Napoleon Form 10/09/2012, 7:08 PM

## 2012-06-17 NOTE — L&D Delivery Note (Signed)
Attestation of Attending Supervision of Advanced Practitioner (CNM/NP)/fellow: Evaluation and management procedures were performed by the Advanced Practitioner under my supervision and collaboration.  I have reviewed the Advanced Practitioner's note and chart, and I agree with the management and plan.  HARRAWAY-SMITH, Nykerria Macconnell 12:58 PM     

## 2012-07-07 ENCOUNTER — Other Ambulatory Visit: Payer: Medicaid Other

## 2012-07-07 ENCOUNTER — Encounter: Payer: Medicaid Other | Admitting: Obstetrics and Gynecology

## 2012-09-18 LAB — OB RESULTS CONSOLE GBS: GBS: NEGATIVE

## 2012-10-05 ENCOUNTER — Encounter (HOSPITAL_COMMUNITY): Payer: Self-pay | Admitting: *Deleted

## 2012-10-05 ENCOUNTER — Inpatient Hospital Stay (HOSPITAL_COMMUNITY)
Admission: AD | Admit: 2012-10-05 | Discharge: 2012-10-06 | Disposition: A | Discharge status: Hospice | Payer: Medicaid Other | Source: Ambulatory Visit | Attending: Obstetrics and Gynecology | Admitting: Obstetrics and Gynecology

## 2012-10-05 DIAGNOSIS — R109 Unspecified abdominal pain: Secondary | ICD-10-CM | POA: Insufficient documentation

## 2012-10-05 DIAGNOSIS — O212 Late vomiting of pregnancy: Secondary | ICD-10-CM | POA: Insufficient documentation

## 2012-10-05 DIAGNOSIS — R319 Hematuria, unspecified: Secondary | ICD-10-CM | POA: Insufficient documentation

## 2012-10-05 DIAGNOSIS — O219 Vomiting of pregnancy, unspecified: Secondary | ICD-10-CM

## 2012-10-05 DIAGNOSIS — R42 Dizziness and giddiness: Secondary | ICD-10-CM | POA: Insufficient documentation

## 2012-10-05 LAB — URINALYSIS, ROUTINE W REFLEX MICROSCOPIC
Bilirubin Urine: NEGATIVE
Glucose, UA: NEGATIVE mg/dL
Ketones, ur: NEGATIVE mg/dL
Leukocytes, UA: NEGATIVE
Nitrite: NEGATIVE
Protein, ur: NEGATIVE mg/dL

## 2012-10-05 LAB — URINE MICROSCOPIC-ADD ON

## 2012-10-05 NOTE — MAU Provider Note (Signed)
History     CSN: [id number]  Arrival date & time 10/05/12  2307   None     No chief complaint on file.   HPI  Helen Tyler is a 27 y.o. [phone] at [redacted]w[redacted]d who presents complaining of vomiting and blood in her urine.  Reports she has been vomiting all day, about 4-5 times today. She urinated earlier and had blood on the toilet paper when she wiped. There was a small amount of clot in the toilet. No blood in her underwear. Has had contractions every 30-45 minutes. No fluid leaking. Felt baby move just now. Has had some abdominal pain for several weeks in the bottom of her abdomen, which her doctor told her was from the baby's head putting pressure in her pelvis. Also reports some dizziness. She came to the MAU "to be safe".  Denies fever or any problems in this pregnancy except for nausea early on.  Past Medical History  Diagnosis Date  . No pertinent past medical history   . Abnormal Pap smear 2002    Laser procedure done;Last pap 03/2012 was normal  . Infection     BV;not frequent;last dx'd 03/2012 was treated w/ Metrogel  . Infection     Yeast;not frequent  . Chlamydia 2001    was treated   Patient denies having any medical problems  Past Surgical History  Procedure Laterality Date  . No past surgeries    no surgeries  Family History  Problem Relation Age of Onset  . Other Neg Hx   . Hypertension Mother   . Hypertension Maternal Grandmother   . Cancer Maternal Grandmother     Stomach  . Congestive Heart Failure Maternal Grandmother   . Mental illness Father     History  Substance Use Topics  . Smoking status: Former Smoker    Quit date: 01/16/2012  . Smokeless tobacco: Never Used  . Alcohol Use: No  quit smoking august when found out pregnant No alcohol or drugs  OB History   Grav Para Term Preterm Abortions TAB SAB Ect Mult Living   4 1 1  2 2    1     no problems during prior pregnancy, vaginal delivery  Review of Systems No fever. + fetal movement.  + contractions. No fluid gush. + blood in urine  Allergies  Review of patient's allergies indicates no known allergies.  Home Medications   Only takes prenatal vitamins Has rx at home for phenergan  BP 106/63  Pulse 97  Temp(Src) 99 F (37.2 C) (Oral)  Resp 16  Ht 5\' 5"  (1.651 m)  Wt 168 lb (76.204 kg)  BMI 27.96 kg/m2  LMP 12/14/2011  Physical Exam Gen: NAD HEENT: mouth slightly dry Heart: RRR Lungs: CTAB, NWOB Abd: gravid but otherwise soft, nontender to palpation Ext: no appreciable lower extremity edema bilaterally. Cap refill <3 seconds. Neuro: grossly nonfocal, speech intact GU: normal appearing external genitalia Dilation: 1 Effacement (%): 70 Cervical Position: Middle;Posterior Station: -2 Presentation: Vertex Exam by:: Elie Confer RN   MAU Course  Procedures (including critical care time)  Labs Reviewed  URINALYSIS, ROUTINE W REFLEX MICROSCOPIC - Abnormal; Notable for the following:    Hgb urine dipstick TRACE (*)    Urobilinogen, UA 2.0 (*)    All other components within normal limits  URINE MICROSCOPIC-ADD ON - Abnormal; Notable for the following:    Squamous Epithelial / LPF FEW (*)    All other components within normal limits   No results  found.  FHR: baseline 140s, moderate variability, + accels, no decels Toco: very rare contractions  1. Vomiting complicating pregnancy    MDM  Helen Tyler is a 27 y.o. [id number] at [redacted]w[redacted]d who presents with vomiting. Likely secondary to viral gastritis. Have offered to patient IV fluids and either PO or IV antiemetic here in the MAU, but she would prefer just to get an outpatient rx and go home. Will send home with zofran. Has follow up appointment already scheduled at health department on 4/23. Counseled patient on reasons to return (decreased fetal movement, increased contractions, bleeding, fluid leaking).  Levert Feinstein, MD Family Medicine PGY-1  .I have seen the patient with the resident/student and  agree with the above.  Tawnya Crook

## 2012-10-05 NOTE — MAU Note (Signed)
Started vomiting today.  Unable to keep anything down all day.  Started with blood in urine tonight @ 1800.  Denies contractions, vag bleeding, or leaking.   States fetus is moving ok.

## 2012-10-06 MED ORDER — ONDANSETRON 4 MG PO TBDP: 4.0000 mg | ORAL_TABLET | Freq: Three times a day (TID) | ORAL | Status: DC | PRN

## 2012-10-08 NOTE — MAU Provider Note (Signed)
Attestation of Attending Supervision of Advanced Practitioner (CNM/NP): Evaluation and management procedures were performed by the Advanced Practitioner under my supervision and collaboration.  I have reviewed the Advanced Practitioner's note and chart, and I agree with the management and plan.  Benno Brensinger 10/08/2012 9:21 PM

## 2012-10-09 ENCOUNTER — Encounter (HOSPITAL_COMMUNITY): Payer: Self-pay | Admitting: *Deleted

## 2012-10-09 ENCOUNTER — Encounter (HOSPITAL_COMMUNITY): Payer: Self-pay | Admitting: Anesthesiology

## 2012-10-09 ENCOUNTER — Inpatient Hospital Stay (HOSPITAL_COMMUNITY)
Admission: AD | Admit: 2012-10-09 | Discharge: 2012-10-11 | DRG: 775 | Disposition: A | Discharge status: Home / Self Care | Payer: Medicaid Other | Source: Ambulatory Visit | Attending: Obstetrics & Gynecology | Admitting: Obstetrics & Gynecology

## 2012-10-09 ENCOUNTER — Inpatient Hospital Stay (HOSPITAL_COMMUNITY): Payer: Medicaid Other | Admitting: Anesthesiology

## 2012-10-09 DIAGNOSIS — IMO0001 Reserved for inherently not codable concepts without codable children: Secondary | ICD-10-CM

## 2012-10-09 LAB — CBC
HCT: 27.7 % — ABNORMAL LOW (ref 36.0–46.0)
Hemoglobin: 9 g/dL — ABNORMAL LOW (ref 12.0–15.0)
MCHC: 32.5 g/dL (ref 30.0–36.0)
WBC: 9.6 10*3/uL (ref 4.0–10.5)

## 2012-10-09 LAB — TYPE AND SCREEN
ABO/RH(D): B POS
Antibody Screen: NEGATIVE

## 2012-10-09 MED ORDER — LIDOCAINE HCL (PF) 1 % IJ SOLN
30.0000 mL | INTRAMUSCULAR | Status: DC | PRN
  Filled 2012-10-09: qty 30

## 2012-10-09 MED ORDER — SIMETHICONE 80 MG PO CHEW: 80.0000 mg | CHEWABLE_TABLET | ORAL | Status: DC | PRN

## 2012-10-09 MED ORDER — SODIUM BICARBONATE 8.4 % IV SOLN
INTRAVENOUS | Status: DC | PRN
  Administered 2012-10-09: 5 mL via EPIDURAL

## 2012-10-09 MED ORDER — FLEET ENEMA 7-19 GM/118ML RE ENEM: 1.0000 | ENEMA | Freq: Every day | RECTAL | Status: DC | PRN

## 2012-10-09 MED ORDER — ZOLPIDEM TARTRATE 5 MG PO TABS: 5.0000 mg | ORAL_TABLET | Freq: Every evening | ORAL | Status: DC | PRN

## 2012-10-09 MED ORDER — WITCH HAZEL-GLYCERIN EX PADS: 1.0000 "application " | MEDICATED_PAD | CUTANEOUS | Status: DC | PRN

## 2012-10-09 MED ORDER — EPHEDRINE 5 MG/ML INJ
10.0000 mg | INTRAVENOUS | Status: DC | PRN
  Filled 2012-10-09: qty 2

## 2012-10-09 MED ORDER — NALBUPHINE SYRINGE 5 MG/0.5 ML
10.0000 mg | INJECTION | INTRAMUSCULAR | Status: DC | PRN
  Administered 2012-10-09: 10 mg via INTRAVENOUS
  Filled 2012-10-09 (×2): qty 1

## 2012-10-09 MED ORDER — EPHEDRINE 5 MG/ML INJ
10.0000 mg | INTRAVENOUS | Status: DC | PRN
  Filled 2012-10-09: qty 4
  Filled 2012-10-09: qty 2

## 2012-10-09 MED ORDER — SENNOSIDES-DOCUSATE SODIUM 8.6-50 MG PO TABS
2.0000 | ORAL_TABLET | Freq: Every day | ORAL | Status: DC
  Administered 2012-10-09: 2 via ORAL

## 2012-10-09 MED ORDER — BISACODYL 10 MG RE SUPP: 10.0000 mg | Freq: Every day | RECTAL | Status: DC | PRN

## 2012-10-09 MED ORDER — LANOLIN HYDROUS EX OINT: TOPICAL_OINTMENT | CUTANEOUS | Status: DC | PRN

## 2012-10-09 MED ORDER — OXYTOCIN 40 UNITS IN LACTATED RINGERS INFUSION - SIMPLE MED
1.0000 m[IU]/min | INTRAVENOUS | Status: DC
  Administered 2012-10-09: 2 m[IU]/min via INTRAVENOUS
  Administered 2012-10-09: 6 m[IU]/min via INTRAVENOUS

## 2012-10-09 MED ORDER — LACTATED RINGERS IV SOLN
INTRAVENOUS | Status: DC
  Administered 2012-10-09: 12:00:00 via INTRAVENOUS
  Administered 2012-10-09: 125 mL via INTRAVENOUS

## 2012-10-09 MED ORDER — FENTANYL 2.5 MCG/ML BUPIVACAINE 1/10 % EPIDURAL INFUSION (WH - ANES)
14.0000 mL/h | INTRAMUSCULAR | Status: DC | PRN
  Administered 2012-10-09: 14 mL/h via EPIDURAL
  Filled 2012-10-09: qty 125

## 2012-10-09 MED ORDER — ONDANSETRON HCL 4 MG/2ML IJ SOLN: 4.0000 mg | INTRAMUSCULAR | Status: DC | PRN

## 2012-10-09 MED ORDER — LACTATED RINGERS IV SOLN: 500.0000 mL | INTRAVENOUS | Status: DC | PRN

## 2012-10-09 MED ORDER — ACETAMINOPHEN 325 MG PO TABS: 650.0000 mg | ORAL_TABLET | ORAL | Status: DC | PRN

## 2012-10-09 MED ORDER — ONDANSETRON HCL 4 MG/2ML IJ SOLN: 4.0000 mg | Freq: Four times a day (QID) | INTRAMUSCULAR | Status: DC | PRN

## 2012-10-09 MED ORDER — PHENYLEPHRINE 40 MCG/ML (10ML) SYRINGE FOR IV PUSH (FOR BLOOD PRESSURE SUPPORT)
80.0000 ug | PREFILLED_SYRINGE | INTRAVENOUS | Status: DC | PRN
  Filled 2012-10-09: qty 2
  Filled 2012-10-09: qty 5

## 2012-10-09 MED ORDER — FLEET ENEMA 7-19 GM/118ML RE ENEM: 1.0000 | ENEMA | RECTAL | Status: DC | PRN

## 2012-10-09 MED ORDER — OXYTOCIN 40 UNITS IN LACTATED RINGERS INFUSION - SIMPLE MED: 62.5000 mL/h | INTRAVENOUS | Status: DC | PRN

## 2012-10-09 MED ORDER — IBUPROFEN 600 MG PO TABS
600.0000 mg | ORAL_TABLET | Freq: Four times a day (QID) | ORAL | Status: DC | PRN
  Administered 2012-10-09: 600 mg via ORAL
  Filled 2012-10-09: qty 1

## 2012-10-09 MED ORDER — BENZOCAINE-MENTHOL 20-0.5 % EX AERO: 1.0000 "application " | INHALATION_SPRAY | CUTANEOUS | Status: DC | PRN

## 2012-10-09 MED ORDER — OXYTOCIN 40 UNITS IN LACTATED RINGERS INFUSION - SIMPLE MED
62.5000 mL/h | INTRAVENOUS | Status: DC
  Filled 2012-10-09: qty 1000

## 2012-10-09 MED ORDER — PHENYLEPHRINE 40 MCG/ML (10ML) SYRINGE FOR IV PUSH (FOR BLOOD PRESSURE SUPPORT)
80.0000 ug | PREFILLED_SYRINGE | INTRAVENOUS | Status: DC | PRN
  Filled 2012-10-09: qty 2

## 2012-10-09 MED ORDER — DIBUCAINE 1 % RE OINT: 1.0000 "application " | TOPICAL_OINTMENT | RECTAL | Status: DC | PRN

## 2012-10-09 MED ORDER — LACTATED RINGERS IV SOLN
500.0000 mL | Freq: Once | INTRAVENOUS | Status: AC
  Administered 2012-10-09: 500 mL via INTRAVENOUS

## 2012-10-09 MED ORDER — OXYCODONE-ACETAMINOPHEN 5-325 MG PO TABS: 1.0000 | ORAL_TABLET | ORAL | Status: DC | PRN

## 2012-10-09 MED ORDER — IBUPROFEN 600 MG PO TABS
600.0000 mg | ORAL_TABLET | Freq: Four times a day (QID) | ORAL | Status: DC
  Administered 2012-10-10 (×5): 600 mg via ORAL
  Filled 2012-10-09 (×5): qty 1

## 2012-10-09 MED ORDER — DIPHENHYDRAMINE HCL 50 MG/ML IJ SOLN
12.5000 mg | INTRAMUSCULAR | Status: DC | PRN
  Administered 2012-10-09 (×2): 12.5 mg via INTRAVENOUS
  Filled 2012-10-09: qty 1

## 2012-10-09 MED ORDER — DIPHENHYDRAMINE HCL 25 MG PO CAPS: 25.0000 mg | ORAL_CAPSULE | Freq: Four times a day (QID) | ORAL | Status: DC | PRN

## 2012-10-09 MED ORDER — CITRIC ACID-SODIUM CITRATE 334-500 MG/5ML PO SOLN: 30.0000 mL | ORAL | Status: DC | PRN

## 2012-10-09 MED ORDER — MEASLES, MUMPS & RUBELLA VAC ~~LOC~~ INJ: 0.5000 mL | INJECTION | Freq: Once | SUBCUTANEOUS | Status: DC

## 2012-10-09 MED ORDER — TERBUTALINE SULFATE 1 MG/ML IJ SOLN: 0.2500 mg | Freq: Once | INTRAMUSCULAR | Status: DC | PRN

## 2012-10-09 MED ORDER — OXYTOCIN BOLUS FROM INFUSION: 500.0000 mL | INTRAVENOUS | Status: DC

## 2012-10-09 MED ORDER — ONDANSETRON HCL 4 MG PO TABS: 4.0000 mg | ORAL_TABLET | ORAL | Status: DC | PRN

## 2012-10-09 MED ORDER — PRENATAL MULTIVITAMIN CH
1.0000 | ORAL_TABLET | Freq: Every day | ORAL | Status: DC
  Administered 2012-10-10: 1 via ORAL
  Filled 2012-10-09: qty 1

## 2012-10-09 MED ORDER — FERROUS SULFATE 325 (65 FE) MG PO TABS
325.0000 mg | ORAL_TABLET | Freq: Two times a day (BID) | ORAL | Status: DC
  Administered 2012-10-10 – 2012-10-11 (×3): 325 mg via ORAL
  Filled 2012-10-09 (×3): qty 1

## 2012-10-09 MED ORDER — TETANUS-DIPHTH-ACELL PERTUSSIS 5-2.5-18.5 LF-MCG/0.5 IM SUSP: 0.5000 mL | Freq: Once | INTRAMUSCULAR | Status: DC

## 2012-10-09 NOTE — Progress Notes (Signed)
Dr. Jean Rosenthal currently in pt room.  Was asked to give him about 5 min and call back.

## 2012-10-09 NOTE — Anesthesia Procedure Notes (Signed)

## 2012-10-09 NOTE — H&P (Signed)
Attestation of Attending Supervision of Advanced Practitioner (CNM/NP)/Fellow: Evaluation and management procedures were performed by the Advanced Practitioner under my supervision and collaboration.  I have reviewed the Advanced Practitioner's note and chart, and I agree with the management and plan.  HARRAWAY-SMITH, Leroy Pettway 11:48 AM

## 2012-10-09 NOTE — Progress Notes (Signed)
Pt itching so bad on abdomen had removed both monitors. Pt received benadryl earlier for itching, no relief. Gave 10mg  Nubain IV for itching

## 2012-10-09 NOTE — H&P (Signed)
Helen Tyler is a 27 y.o. female presenting for contractions at term. Maternal Medical History:  Reason for admission: Contractions.   Contractions: Onset was yesterday.   Frequency: regular.   Perceived severity is strong.    Fetal activity: Perceived fetal activity is normal.   Last perceived fetal movement was within the past hour.    Prenatal complications: no prenatal complications Prenatal Complications - Diabetes: none.    OB History   Grav Para Term Preterm Abortions TAB SAB Ect Mult Living   4 1 1  2 2    1      Past Medical History  Diagnosis Date  . No pertinent past medical history   . Abnormal Pap smear 2002    Laser procedure done;Last pap 03/2012 was normal  . Infection     BV;not frequent;last dx'd 03/2012 was treated w/ Metrogel  . Infection     Yeast;not frequent  . Chlamydia 2001    was treated   Past Surgical History  Procedure Laterality Date  . No past surgeries     Family History: family history includes Cancer in her maternal grandmother; Congestive Heart Failure in her maternal grandmother; Hypertension in her maternal grandmother and mother; and Mental illness in her father.  There is no history of Other. Social History:  reports that she quit smoking about 8 months ago. She has never used smokeless tobacco. She reports that she uses illicit drugs (Marijuana). She reports that she does not drink alcohol.   Prenatal Transfer Tool  Maternal Diabetes: No Genetic Screening: Normal Maternal Ultrasounds/Referrals: Normal Fetal Ultrasounds or other Referrals:  None Maternal Substance Abuse:  No Significant Maternal Medications:  None Significant Maternal Lab Results:  Lab values include: Group B Strep negative Other Comments:  None  Review of Systems  Constitutional: Negative for fever and chills.  Eyes: Negative for blurred vision and double vision.  Neurological: Negative for dizziness and headaches.    Dilation: 3 Effacement (%):  70 Station: -3;-2 Exam by:: Dr. Thad Ranger Blood pressure 119/80, pulse 89, temperature 98.2 F (36.8 C), temperature source Oral, resp. rate 16, height 5\' 5"  (1.651 m), weight 74.39 kg (164 lb), last menstrual period 12/14/2011, SpO2 100.00%. Maternal Exam:  Uterine Assessment: Contraction strength is moderate.  Abdomen: Patient reports no abdominal tenderness. Fetal presentation: vertex  Introitus: Normal vulva. Vagina is positive for vaginal discharge (yellow, creamy).  Ferning test: not done.   Pelvis: adequate for delivery.   Cervix: Cervix evaluated by digital exam.     Fetal Exam Fetal Monitor Review: Mode: ultrasound.   Baseline rate: 145.  Variability: moderate (6-25 bpm).   Pattern: accelerations present and no decelerations.    Fetal State Assessment: Category I - tracings are normal.     Physical Exam  Constitutional: She is oriented to person, place, and time. She appears well-developed and well-nourished. She appears distressed (mild).  HENT:  Head: Normocephalic and atraumatic.  Eyes: Conjunctivae and EOM are normal.  Neck: Normal range of motion. Neck supple.  Cardiovascular: Normal rate.   Respiratory: Effort normal. No respiratory distress.  GI: Soft. There is no rebound and no guarding.  Genitourinary: Vaginal discharge (yellow, creamy) found.  Cervix 3-4/80/-2  Musculoskeletal: Normal range of motion. She exhibits no edema and no tenderness.  Neurological: She is alert and oriented to person, place, and time.  Skin: Skin is warm and dry.    Prenatal labs: ABO, Rh: B/POS/-- (11/08 1038) Antibody: NEG (11/08 1038) Rubella: >500.0 (11/08 1038) RPR: NON REAC (  11/08 1038)  HBsAg: NEGATIVE (11/08 1038)  HIV: NON REACTIVE (11/08 1038)  GBS: Negative (04/04 0000)   Assessment/Plan: 27 y.o. [id number] at [redacted]w[redacted]d with active labor - Admit to L&D - Epidural when desired - GBS negative - Anticipate SVD  Napoleon Form 10/09/2012, 11:39 AM

## 2012-10-09 NOTE — MAU Note (Signed)
Patient states she is having contractions every 10 minutes with mucus discharge. Reports good fetal movement.

## 2012-10-09 NOTE — Anesthesia Preprocedure Evaluation (Signed)

## 2012-10-10 NOTE — Anesthesia Postprocedure Evaluation (Signed)
  Anesthesia Post-op Note  Patient: Helen Tyler  Procedure(s) Performed: * No procedures listed *  Patient Location: Mother/Baby  Anesthesia Type:Epidural  Level of Consciousness: awake and alert   Airway and Oxygen Therapy: Patient Spontanous Breathing  Post-op Pain: mild  Post-op Assessment: Patient's Cardiovascular Status Stable, Respiratory Function Stable, No signs of Nausea or vomiting, Pain level controlled, No headache, No residual numbness and No residual motor weakness  Post-op Vital Signs: Reviewed and stable  Complications: No apparent anesthesia complications

## 2012-10-10 NOTE — Progress Notes (Signed)
Post Partum Day #1 Subjective: no complaints, up ad lib and tolerating PO; breastfeeding going slowly; desires Nexplanon for contraception  Objective: Blood pressure 120/76, pulse 85, temperature 98.2 F (36.8 C), temperature source Oral, resp. rate 18, height 5\' 5"  (1.651 m), weight 164 lb (74.39 kg), last menstrual period 12/14/2011, SpO2 99.00%, unknown if currently breastfeeding.  Physical Exam:  General: alert, cooperative and no distress Lochia: appropriate Uterine Fundus: firm DVT Evaluation: No evidence of DVT seen on physical exam.   Recent Labs  10/09/12 1200  HGB 9.0*  HCT 27.7*    Assessment/Plan: Plan for discharge tomorrow and Lactation consult   LOS: 1 day   SHAW, Ranika 10/10/2012, 7:19 AM

## 2012-10-11 MED ORDER — IBUPROFEN 600 MG PO TABS: 600.0000 mg | ORAL_TABLET | Freq: Four times a day (QID) | ORAL | Status: DC | PRN

## 2012-10-11 NOTE — Discharge Summary (Signed)
Obstetric Discharge Summary Reason for Admission: onset of labor Prenatal Procedures: none Intrapartum Procedures: spontaneous vaginal delivery Postpartum Procedures: none Complications-Operative and Postpartum: none Eating, drinking, voiding, ambulating well.  +flatus.  Lochia and pain wnl.  Denies dizziness, lightheadedness, sob. No complaints.  Hemoglobin  Date Value Range Status  10/09/2012 9.0* 12.0 - 15.0 g/dL Final     HCT  Date Value Range Status  10/09/2012 27.7* 36.0 - 46.0 % Final    Physical Exam:  General: alert, cooperative and no distress Lochia: appropriate Uterine Fundus: firm Incision: n/a DVT Evaluation: No evidence of DVT seen on physical exam. Negative Homan's sign. No cords or calf tenderness. No significant calf/ankle edema.  Discharge Diagnoses: Term Pregnancy-delivered  Discharge Information: Date: 10/11/2012 Activity: pelvic rest Diet: routine Medications: PNV and Ibuprofen Condition: stable Instructions: refer to practice specific booklet Discharge to: home Follow-up Information   Follow up with [facility] [place]. Schedule an appointment as soon as possible for a visit in 6 weeks. (for your postpartum visit)    Contact information:   [address] [place] [address] [phone]      Newborn Data: Live born female  Birth Weight: 6 lb 10.7 oz (3025 g) APGAR: 8, 9  Home with mother. Breastfeeding, some problems w/ latch- LC to see prior to d/c, then pt to call after d/c if needed Nexplanon for contraception, OP circumcision   Nancyann, Cotterman 10/11/2012, 7:43 AM

## 2012-10-12 NOTE — Progress Notes (Signed)
Post discharge chart review completed.  

## 2013-12-11 ENCOUNTER — Encounter (HOSPITAL_COMMUNITY): Payer: Self-pay | Admitting: Emergency Medicine

## 2013-12-11 ENCOUNTER — Emergency Department (INDEPENDENT_AMBULATORY_CARE_PROVIDER_SITE_OTHER)
Admission: EM | Admit: 2013-12-11 | Discharge: 2013-12-11 | Disposition: A | Discharge status: Home / Self Care | Payer: Self-pay | Source: Home / Self Care | Attending: Emergency Medicine | Admitting: Emergency Medicine

## 2013-12-11 DIAGNOSIS — K5289 Other specified noninfective gastroenteritis and colitis: Secondary | ICD-10-CM

## 2013-12-11 DIAGNOSIS — K529 Noninfective gastroenteritis and colitis, unspecified: Secondary | ICD-10-CM

## 2013-12-11 MED ORDER — ONDANSETRON HCL 4 MG PO TABS: 4.0000 mg | ORAL_TABLET | Freq: Three times a day (TID) | ORAL | Status: DC | PRN

## 2013-12-11 MED ORDER — ONDANSETRON 4 MG PO TBDP
4.0000 mg | ORAL_TABLET | Freq: Once | ORAL | Status: AC
  Administered 2013-12-11: 4 mg via ORAL

## 2013-12-11 MED ORDER — ONDANSETRON 4 MG PO TBDP
ORAL_TABLET | ORAL | Status: AC
  Filled 2013-12-11: qty 1

## 2013-12-11 NOTE — ED Provider Notes (Signed)
CSN: [id number]     Arrival date & time 12/11/13  1227 History   First MD Initiated Contact with Patient 12/11/13 1312     Chief Complaint  Patient presents with  . Emesis   (Consider location/radiation/quality/duration/timing/severity/associated sxs/prior Treatment) HPI Comments: Patient reports she began experiencing N/V/D with associated abdominal cramping while at work yesterday and symptoms have persisted today. States her boyfriend woke this morning with same illness.  Denies fever/chills. Denies urinary symptoms.  No hematemesis or blood in stool.   No suspicious food intake or recent travel or camping  The history is provided by the patient.    Past Medical History  Diagnosis Date  . No pertinent past medical history   . Abnormal Pap smear 2002    Laser procedure done;Last pap 03/2012 was normal  . Infection     BV;not frequent;last dx'd 03/2012 was treated w/ Metrogel  . Infection     Yeast;not frequent  . Chlamydia 2001    was treated   Past Surgical History  Procedure Laterality Date  . No past surgeries     Family History  Problem Relation Age of Onset  . Other Neg Hx   . Hypertension Mother   . Hypertension Maternal Grandmother   . Cancer Maternal Grandmother     Stomach  . Congestive Heart Failure Maternal Grandmother   . Mental illness Father    History  Substance Use Topics  . Smoking status: Former Smoker    Quit date: 01/16/2012  . Smokeless tobacco: Never Used  . Alcohol Use: No   OB History   Grav Para Term Preterm Abortions TAB SAB Ect Mult Living   4 2 2  2 2    2      Review of Systems  All other systems reviewed and are negative.   Allergies  Review of patient's allergies indicates no known allergies.  Home Medications   Prior to Admission medications   Medication Sig Start Date End Date Taking? Authorizing Provider  ibuprofen (ADVIL,MOTRIN) 600 MG tablet Take 1 tablet (600 mg total) by mouth every 6 (six) hours as needed for  pain. 10/11/12   Marge DuncansKimberly Randall Booker, CNM  ondansetron (ZOFRAN) 4 MG tablet Take 1 tablet (4 mg total) by mouth every 8 (eight) hours as needed for nausea or vomiting. 12/11/13   Ardis RowanJennifer Lee Presson, PA  Prenatal Vit-Fe Fumarate-FA (PRENATAL MULTIVITAMIN) TABS Take 1 tablet by mouth daily at 12 noon.    Historical Provider, MD   BP 132/56  Pulse 89  Temp(Src) 99.6 F (37.6 C) (Oral)  Resp 16  SpO2 97%  LMP 12/11/2013 Physical Exam  Nursing note and vitals reviewed. Constitutional: She is oriented to person, place, and time. Vital signs are normal. She appears well-developed and well-nourished. She is active and cooperative.  Non-toxic appearance. She does not have a sickly appearance. She does not appear ill. No distress.  HENT:  Head: Normocephalic and atraumatic.  Mouth/Throat: Oropharynx is clear and moist.  Eyes: Conjunctivae are normal. No scleral icterus.  Cardiovascular: Normal rate, regular rhythm and normal heart sounds.   Pulmonary/Chest: Effort normal and breath sounds normal.  Abdominal: Soft. Bowel sounds are normal. She exhibits no distension. There is no tenderness.  Musculoskeletal: Normal range of motion.  Neurological: She is alert and oriented to person, place, and time.  Skin: Skin is warm and dry. No rash noted. No erythema.  Psychiatric: She has a normal mood and affect. Her behavior is normal.    ED  Course  Procedures (including critical care time) Labs Review Labs Reviewed - No data to display  Imaging Review No results found.   MDM   1. Gastroenteritis    Patient likely experiencing viral gastroenteritis. Will provide 4mg  ODT Zofran and [facility]. If patient able to tolerate clear fluids following zofran, will d/c home with Rx for zofran for use at home.  Printed instructions about expected course of illness to also be provided.   Re-eval: patient states she is feeling better following Zofran and is tolerating oral  fluids.  Jess BartersJennifer Lee NipinnawaseePresson, GeorgiaPA 12/11/13 [phone]

## 2013-12-11 NOTE — Discharge Instructions (Signed)

## 2013-12-11 NOTE — ED Notes (Signed)
Vomiting and diarrhea since yesterday.  Reports 4 episodes of vomiting today, 3 episodes of diarrhea today.  Reports abdominal cramping and that family member has the same

## 2013-12-15 NOTE — ED Provider Notes (Signed)
Medical screening examination/treatment/procedure(s) were performed by non-physician practitioner and as supervising physician I was immediately available for consultation/collaboration.  David Keller, M.D.  David C Keller, MD 12/15/13 0744 

## 2014-04-18 ENCOUNTER — Encounter (HOSPITAL_COMMUNITY): Payer: Self-pay | Admitting: Emergency Medicine

## 2014-11-20 ENCOUNTER — Emergency Department (HOSPITAL_COMMUNITY): Payer: Medicaid Other

## 2014-11-20 ENCOUNTER — Encounter (HOSPITAL_COMMUNITY): Payer: Self-pay | Admitting: *Deleted

## 2014-11-20 ENCOUNTER — Emergency Department (HOSPITAL_COMMUNITY)
Admission: EM | Admit: 2014-11-20 | Discharge: 2014-11-21 | Disposition: A | Discharge status: Home / Self Care | Payer: Self-pay | Attending: Emergency Medicine | Admitting: Emergency Medicine

## 2014-11-20 DIAGNOSIS — Z87891 Personal history of nicotine dependence: Secondary | ICD-10-CM | POA: Insufficient documentation

## 2014-11-20 DIAGNOSIS — Z8619 Personal history of other infectious and parasitic diseases: Secondary | ICD-10-CM | POA: Insufficient documentation

## 2014-11-20 DIAGNOSIS — R079 Chest pain, unspecified: Secondary | ICD-10-CM | POA: Insufficient documentation

## 2014-11-20 DIAGNOSIS — J069 Acute upper respiratory infection, unspecified: Secondary | ICD-10-CM | POA: Insufficient documentation

## 2014-11-20 LAB — CBC
HCT: 37.9 % (ref 36.0–46.0)
Hemoglobin: 12 g/dL (ref 12.0–15.0)
MCH: 26.3 pg (ref 26.0–34.0)
MCHC: 31.7 g/dL (ref 30.0–36.0)
MCV: 82.9 fL (ref 78.0–100.0)
PLATELETS: 315 10*3/uL (ref 150–400)
RBC: 4.57 MIL/uL (ref 3.87–5.11)
RDW: 13.7 % (ref 11.5–15.5)
WBC: 8.3 10*3/uL (ref 4.0–10.5)

## 2014-11-20 LAB — BASIC METABOLIC PANEL
Anion gap: 7 (ref 5–15)
BUN: 16 mg/dL (ref 6–20)
CALCIUM: 9.5 mg/dL (ref 8.9–10.3)
CHLORIDE: 107 mmol/L (ref 101–111)
CO2: 26 mmol/L (ref 22–32)
Creatinine, Ser: 0.65 mg/dL (ref 0.44–1.00)
Glucose, Bld: 91 mg/dL (ref 65–99)
POTASSIUM: 3.2 mmol/L — AB (ref 3.5–5.1)
SODIUM: 140 mmol/L (ref 135–145)

## 2014-11-20 LAB — I-STAT TROPONIN, ED: Troponin i, poc: 0 ng/mL (ref 0.00–0.08)

## 2014-11-20 NOTE — ED Provider Notes (Signed)
CSN: [id number]     Arrival date & time 11/20/14  1843 History   First MD Initiated Contact with Patient 11/20/14 2148     Chief Complaint  Patient presents with  . Chest Pain     (Consider location/radiation/quality/duration/timing/severity/associated sxs/prior Treatment) HPI Comments: Patient presents today with a chief complaint of chest tightness and SOB.  She states that she began having the tightness around 10 AM this morning and has been constant since that time.  However, the pain is improving.  Pain is substernal and does not radiate.  No association with exertion or eating.  She also reports an associated productive cough over the past couple of weeks.  She denies hemoptysis, fever, chills, nausea, vomiting, LE edema/erythema, numbness, or tingling.  She denies prior cardiac history.  Denies history of PE or DVT.  Denies prolonged travel or surgeries in the past 4 weeks.  Denies exogenous estrogen use.  No LE edema or pain.  Denies any history of Asthma.  She quit smoking three years ago.  Patient is a 29 y.o. female presenting with chest pain. The history is provided by the patient.  Chest Pain   Past Medical History  Diagnosis Date  . No pertinent past medical history   . Abnormal Pap smear 2002    Laser procedure done;Last pap 03/2012 was normal  . Infection     BV;not frequent;last dx'd 03/2012 was treated w/ Metrogel  . Infection     Yeast;not frequent  . Chlamydia 2001    was treated   Past Surgical History  Procedure Laterality Date  . No past surgeries     Family History  Problem Relation Age of Onset  . Other Neg Hx   . Hypertension Mother   . Hypertension Maternal Grandmother   . Cancer Maternal Grandmother     Stomach  . Congestive Heart Failure Maternal Grandmother   . Mental illness Father    History  Substance Use Topics  . Smoking status: Former Smoker    Quit date: 01/16/2012  . Smokeless tobacco: Never Used  . Alcohol Use: No   OB History    Gravida Para Term Preterm AB TAB SAB Ectopic Multiple Living   Review of Systems  Cardiovascular: Positive for chest pain.  All other systems reviewed and are negative.     Allergies  Review of patient's allergies indicates no known allergies.  Home Medications   Prior to Admission medications   Medication Sig Start Date End Date Taking? Authorizing Provider  ibuprofen (ADVIL,MOTRIN) 600 MG tablet Take 1 tablet (600 mg total) by mouth every 6 (six) hours as needed for pain. 10/11/12   Cheral Marker, CNM  ondansetron (ZOFRAN) 4 MG tablet Take 1 tablet (4 mg total) by mouth every 8 (eight) hours as needed for nausea or vomiting. 12/11/13   Ria Clock, PA  Prenatal Vit-Fe Fumarate-FA (PRENATAL MULTIVITAMIN) TABS Take 1 tablet by mouth daily at 12 noon.    Historical Provider, MD   BP 141/91 mmHg  Pulse 83  Temp(Src) 98.3 F (36.8 C) (Oral)  Resp 14  SpO2 100%  LMP 10/30/2014 (Approximate) Physical Exam  Constitutional: She appears well-developed and well-nourished. No distress.  HENT:  Head: Normocephalic and atraumatic.  Mouth/Throat: Oropharynx is clear and moist.  Neck: Normal range of motion. Neck supple.  Cardiovascular: Normal rate, regular rhythm and normal heart sounds.   Pulses:  Dorsalis pedis pulses are 2+ on the right side, and 2+ on the left side.  Pulmonary/Chest: Effort normal and breath sounds normal. No respiratory distress. She has no wheezes. She has no rales. She exhibits tenderness.  Abdominal: Soft. Bowel sounds are normal. She exhibits no distension and no mass. There is no tenderness. There is no rebound and no guarding.  Musculoskeletal: Normal range of motion.  No LE edema or erythema bilaterally  Neurological: She is alert.  Skin: Skin is warm and dry. She is not diaphoretic.  Psychiatric: She has a normal mood and affect.  Nursing note and vitals reviewed.   ED Course  Procedures (including critical care  time) Labs Review Labs Reviewed  BASIC METABOLIC PANEL - Abnormal; Notable for the following:    Potassium 3.2 (*)    All other components within normal limits  CBC  I-STAT TROPOININ, ED  POC URINE PREG, ED    Imaging Review Dg Chest 2 View  11/20/2014   CLINICAL DATA:  Mid chest pain for 1 month getting worse. Shortness of breath.  EXAM: CHEST  2 VIEW  COMPARISON:  None.  FINDINGS: The heart size and mediastinal contours are within normal limits. Both lungs are clear. The visualized skeletal structures are unremarkable.  IMPRESSION: No active cardiopulmonary disease.   Electronically Signed   By: Davonna BellingJohn  Curnes M.D.   On: 11/20/2014 23:20     EKG Interpretation   Date/Time:  Sunday November 20 2014 19:00:46 EDT Ventricular Rate:  93 PR Interval:  142 QRS Duration: 81 QT Interval:  382 QTC Calculation: 475 R Axis:   42 Text Interpretation:  Sinus arrhythmia Ventricular premature complex  Borderline prolonged QT interval Baseline wander in lead(s) V1 Confirmed  by DELO  MD, DOUGLAS ([id number]) on 11/20/2014 10:04:26 PM      MDM   Final diagnoses:  None   Patient is a 29 year old female who presents today with complaints of SOB and chest tightness onset earlier today.   She does report that she has had a productive cough for the past 2 weeks.  Chest wall tender to palpation.  No ischemic changes on EKG.  Troponin is negative.  She is PERC negative.  Suspect that symptoms are secondary to URI.  CXR is negative.  Feel that the patient is stable for discharge.  Return precautions given.    Santiago GladHeather Shakti Fleer, PA-C 11/22/14 1114  Glynn OctaveStephen Rancour, MD 11/22/14 1350

## 2014-11-20 NOTE — ED Notes (Signed)
Pt presents to ED with c/o  Chest tightness and hypertension. Pt is not taking any antihypertensive meds due to no PCP and no insurance,but sts she always has high b/p

## 2016-06-22 ENCOUNTER — Encounter (HOSPITAL_COMMUNITY): Payer: Self-pay | Admitting: Emergency Medicine

## 2016-06-22 ENCOUNTER — Emergency Department (HOSPITAL_COMMUNITY): Payer: Medicaid Other

## 2016-06-22 ENCOUNTER — Emergency Department (HOSPITAL_COMMUNITY)
Admission: EM | Admit: 2016-06-22 | Discharge: 2016-06-22 | Disposition: A | Discharge status: Home / Self Care | Payer: Medicaid Other | Attending: Emergency Medicine | Admitting: Emergency Medicine

## 2016-06-22 DIAGNOSIS — R0789 Other chest pain: Secondary | ICD-10-CM

## 2016-06-22 DIAGNOSIS — R079 Chest pain, unspecified: Secondary | ICD-10-CM | POA: Diagnosis present

## 2016-06-22 DIAGNOSIS — Z87891 Personal history of nicotine dependence: Secondary | ICD-10-CM | POA: Diagnosis not present

## 2016-06-22 LAB — I-STAT TROPONIN, ED: Troponin i, poc: 0 ng/mL (ref 0.00–0.08)

## 2016-06-22 LAB — BASIC METABOLIC PANEL
ANION GAP: 9 (ref 5–15)
BUN: 14 mg/dL (ref 6–20)
CO2: 22 mmol/L (ref 22–32)
Calcium: 9.5 mg/dL (ref 8.9–10.3)
Chloride: 106 mmol/L (ref 101–111)
Creatinine, Ser: 0.49 mg/dL (ref 0.44–1.00)
Glucose, Bld: 85 mg/dL (ref 65–99)
POTASSIUM: 3.6 mmol/L (ref 3.5–5.1)
SODIUM: 137 mmol/L (ref 135–145)

## 2016-06-22 LAB — CBC
HEMATOCRIT: 35 % — AB (ref 36.0–46.0)
HEMOGLOBIN: 11.9 g/dL — AB (ref 12.0–15.0)
MCH: 27.2 pg (ref 26.0–34.0)
MCHC: 34 g/dL (ref 30.0–36.0)
MCV: 79.9 fL (ref 78.0–100.0)
Platelets: 345 10*3/uL (ref 150–400)
RBC: 4.38 MIL/uL (ref 3.87–5.11)
RDW: 14.2 % (ref 11.5–15.5)
WBC: 8.8 10*3/uL (ref 4.0–10.5)

## 2016-06-22 LAB — I-STAT BETA HCG BLOOD, ED (MC, WL, AP ONLY)

## 2016-06-22 MED ORDER — ACETAMINOPHEN 160 MG/5ML PO LIQD: 500.0000 mg | Freq: Four times a day (QID) | ORAL | 0 refills | Status: DC | PRN

## 2016-06-22 MED ORDER — ACETAMINOPHEN 160 MG/5ML PO SOLN
650.0000 mg | Freq: Once | ORAL | Status: AC
  Administered 2016-06-22: 650 mg via ORAL
  Filled 2016-06-22: qty 20.3

## 2016-06-22 NOTE — Discharge Instructions (Signed)
We believe your pain is due to a muscle strain to your chest wall. Take Tylenol as prescribed for pain control. You may also apply ice to areas of pain to limit swelling. Follow-up with your OB/GYN at your scheduled appointment. You may discuss this pain further with your doctor if it persists. Return to the emergency department for new or concerning symptoms.

## 2016-06-22 NOTE — ED Notes (Signed)
Per [facility] chest pain protocol minus DG chest.

## 2016-06-22 NOTE — ED Triage Notes (Signed)
Pt reports constant right sided chest stabbing onset 0800 this morning. Pt verbalizes pain radiates down right arm.

## 2016-06-22 NOTE — ED Provider Notes (Signed)
[facility]-EMERGENCY DEPT Provider Note   CSN: [id number] Arrival date & time: 06/22/16  1641  By signing my name below, I, Linna Darner, attest that this documentation has been prepared under the direction and in the presence [employer], PA-C. Electronically Signed: Linna Darner, Scribe. 06/22/2016. 8:17 PM.   History   Chief Complaint Chief Complaint  Patient presents with  . Chest Pain    The history is provided by the patient. No language interpreter was used.     HPI Comments: Helen Tyler is a 31 y.o. female who presents to the Emergency Department complaining of constant, severe, non-radiating, right-sided chest pain beginning at 8 AM this morning. She states she has had intermittent right-sided chest pain for 3 months, but her current pain is in a slightly different location than usual. She states her chest pain is worse with right arm movement and with applied pressure to the right side of her chest. She has not tried any medications or treatments for her chest pain PTA. She also notes some associated lightheadedness; she states she lost consciousness about 2 weeks ago during a severely painful episode of chest pain but notes no other syncopal episodes in the last 3 months. Pt is ~ [redacted] weeks pregnant and has her first appointment with an OB/GYN in 3 days. No personal or family h/o blood clot. She denies fever, chills, leg swelling, nausea, vomiting, SOB, or any other associated symptoms. No PCP.   Past Medical History:  Diagnosis Date  . Abnormal Pap smear 2002   Laser procedure done;Last pap 03/2012 was normal  . Chlamydia 2001   was treated  . Infection    BV;not frequent;last dx'd 03/2012 was treated w/ Metrogel  . Infection    Yeast;not frequent  . No pertinent past medical history     There are no active problems to display for this patient.   Past Surgical History:  Procedure Laterality Date  . NO PAST SURGERIES      OB History    Gravida Para Term Preterm  AB Living   5 2 2   2 2    SAB TAB Ectopic Multiple Live Births     2     2       Home Medications    Prior to Admission medications   Medication Sig Start Date End Date Taking? Authorizing Provider  acetaminophen (TYLENOL) 160 MG/5ML liquid Take 15.6 mLs (500 mg total) by mouth every 6 (six) hours as needed for pain. 06/22/16   Antony Madura, PA-C  ibuprofen (ADVIL,MOTRIN) 600 MG tablet Take 1 tablet (600 mg total) by mouth every 6 (six) hours as needed for pain. Patient not taking: Reported on 11/20/2014 10/11/12   Cheral Marker, CNM  ondansetron (ZOFRAN) 4 MG tablet Take 1 tablet (4 mg total) by mouth every 8 (eight) hours as needed for nausea or vomiting. Patient not taking: Reported on 11/20/2014 12/11/13   Ria Clock, PA    Family History Family History  Problem Relation Age of Onset  . Hypertension Mother   . Hypertension Maternal Grandmother   . Cancer Maternal Grandmother     Stomach  . Congestive Heart Failure Maternal Grandmother   . Mental illness Father   . Other Neg Hx     Social History Social History  Substance Use Topics  . Smoking status: Former Smoker    Quit date: 01/16/2012  . Smokeless tobacco: Never Used  . Alcohol use No     Allergies  Patient has no known allergies.   Review of Systems Review of Systems A complete 10 system review of systems was obtained and all systems are negative except as noted in the HPI and PMH.    Physical Exam Updated Vital Signs BP 113/78   Pulse 87   Temp 99.3 F (37.4 C) (Oral)   Resp 16   Ht 5\' 5"  (1.651 m)   Wt 52.6 kg   LMP 04/12/2016   SpO2 100%   BMI 19.30 kg/m   Physical Exam  Constitutional: She is oriented to person, place, and time. She appears well-developed and well-nourished. No distress.  Nontoxic and in no distress  HENT:  Head: Normocephalic and atraumatic.  Eyes: Conjunctivae and EOM are normal. No scleral icterus.  Neck: Normal range of motion.  Cardiovascular: Normal rate,  regular rhythm and intact distal pulses.   Pulmonary/Chest: Effort normal. No respiratory distress. She has no wheezes. She has no rales. She exhibits tenderness.  Lungs clear to auscultation bilaterally. Chest expansion symmetric. There is tender to palpation to the right upper chest wall. No crepitus.  Abdominal: Soft. She exhibits no distension. There is no tenderness. There is no guarding.  Soft, nontender abdomen  Musculoskeletal: Normal range of motion.  Neurological: She is alert and oriented to person, place, and time. She exhibits normal muscle tone. Coordination normal.  GCS 15. Patient moving all extremities.  Skin: Skin is warm and dry. No rash noted. She is not diaphoretic. No erythema. No pallor.  Psychiatric: She has a normal mood and affect. Her behavior is normal.  Nursing note and vitals reviewed.    ED Treatments / Results  Labs (all labs ordered are listed, but only abnormal results are displayed) Labs Reviewed  CBC - Abnormal; Notable for the following:       Result Value   Hemoglobin 11.9 (*)    HCT 35.0 (*)    All other components within normal limits  I-STAT BETA HCG BLOOD, ED ([facility], [facility], [facility] ONLY) - Abnormal; Notable for the following:    I-stat hCG, quantitative >2,000.0 (*)    All other components within normal limits  BASIC METABOLIC PANEL  I-STAT TROPOININ, ED    EKG  EKG Interpretation  Date/Time:  Saturday June 22 2016 16:50:40 EST Ventricular Rate:  89 PR Interval:    QRS Duration: 78 QT Interval:  357 QTC Calculation: 435 R Axis:   67 Text Interpretation:  Sinus rhythm Confirmed by Lincoln Brighamees, Liz [phone]([id number]) on 06/22/2016 9:03:15 PM       Radiology Dg Chest 2 View  Result Date: 06/22/2016 CLINICAL DATA:  Right-sided chest pain and arm pain EXAM: CHEST  2 VIEW COMPARISON:  11/20/2014 FINDINGS: The heart size and mediastinal contours are within normal limits. Both lungs are clear. The visualized skeletal structures are unremarkable. IMPRESSION: No  active cardiopulmonary disease. Electronically Signed   By: Jasmine PangKim  Fujinaga M.D.   On: 06/22/2016 20:51    Procedures Procedures (including critical care time)  DIAGNOSTIC STUDIES: Oxygen Saturation is 100% on RA, normal by my interpretation.    COORDINATION OF CARE: 8:23 PM Discussed treatment plan with pt at bedside and pt agreed to plan.  Medications Ordered in ED Medications  acetaminophen (TYLENOL) solution 650 mg (650 mg Oral Given 06/22/16 2059)     Initial Impression / Assessment and Plan / ED Course  I have reviewed the triage vital signs and the nursing notes.  Pertinent labs & imaging results that were available during my care of  the patient were reviewed by me and considered in my medical decision making (see chart for details).  Clinical Course     31 year old female percent to the emergency department for evaluation of chest pain. Pain has significantly improved with Tylenol. Pain atypical for cardiac etiology. It is located to the right side of the patient's chest. EKG is nonischemic and troponin is 0. Pain is also reproducible on palpation, making musculoskeletal etiology more likely. Patient is PERC negative; low suspicion for PE. No personal or FHx of this. Plan to manage supportively with Tylenol on an outpatient basis. Patient referred to her OB/GYN for follow-up. Return precautions discussed and provided. Patient discharged in stable condition with no unaddressed concerns.   Final Clinical Impressions(s) / ED Diagnoses   Final diagnoses:  Chest wall pain    New Prescriptions Discharge Medication List as of 06/22/2016  9:50 PM    START taking these medications   Details  acetaminophen (TYLENOL) 160 MG/5ML liquid Take 15.6 mLs (500 mg total) by mouth every 6 (six) hours as needed for pain., Starting Sat 06/22/2016, Print        I personally performed the services described in this documentation, which was scribed in my presence. The recorded information has  been reviewed and is accurate.      Antony Madura, PA-C 06/22/16 2234    Tilden Fossa, MD 06/23/16 1537

## 2016-06-22 NOTE — ED Notes (Signed)
Pt c/o chest tightness that became unbearable this morning approx 0800. Pt reports dizziness, and weakness. Denies SOB, or Back/shoulder/jaw pain. Pt AOx4. Pt is [redacted] weeks pregnant.

## 2016-06-25 ENCOUNTER — Ambulatory Visit (INDEPENDENT_AMBULATORY_CARE_PROVIDER_SITE_OTHER): Payer: Self-pay

## 2016-06-25 VITALS — BP 124/78 | HR 78 | Temp 98.0°F

## 2016-06-25 DIAGNOSIS — Z3201 Encounter for pregnancy test, result positive: Secondary | ICD-10-CM

## 2016-06-25 DIAGNOSIS — Z3200 Encounter for pregnancy test, result unknown: Secondary | ICD-10-CM

## 2016-06-25 LAB — POCT URINE PREGNANCY: PREG TEST UR: POSITIVE — AB

## 2016-06-25 MED ORDER — PRENATAL PLUS 27-1 MG PO TABS: 1.0000 | ORAL_TABLET | Freq: Every day | ORAL | 0 refills | Status: DC

## 2016-07-17 ENCOUNTER — Ambulatory Visit (INDEPENDENT_AMBULATORY_CARE_PROVIDER_SITE_OTHER): Payer: Medicaid Other | Admitting: Obstetrics and Gynecology

## 2016-07-17 ENCOUNTER — Other Ambulatory Visit (HOSPITAL_COMMUNITY)
Admission: RE | Admit: 2016-07-17 | Discharge: 2016-07-17 | Disposition: A | Discharge status: Home / Self Care | Payer: Medicaid Other | Source: Ambulatory Visit | Attending: Obstetrics and Gynecology | Admitting: Obstetrics and Gynecology

## 2016-07-17 ENCOUNTER — Encounter: Payer: Self-pay | Admitting: Obstetrics and Gynecology

## 2016-07-17 DIAGNOSIS — Z01419 Encounter for gynecological examination (general) (routine) without abnormal findings: Secondary | ICD-10-CM | POA: Diagnosis present

## 2016-07-17 DIAGNOSIS — Z113 Encounter for screening for infections with a predominantly sexual mode of transmission: Secondary | ICD-10-CM | POA: Insufficient documentation

## 2016-07-17 DIAGNOSIS — Z3491 Encounter for supervision of normal pregnancy, unspecified, first trimester: Secondary | ICD-10-CM

## 2016-07-17 DIAGNOSIS — Z3490 Encounter for supervision of normal pregnancy, unspecified, unspecified trimester: Secondary | ICD-10-CM

## 2016-07-17 DIAGNOSIS — Z1151 Encounter for screening for human papillomavirus (HPV): Secondary | ICD-10-CM | POA: Diagnosis present

## 2016-07-17 NOTE — Progress Notes (Signed)
Patient complains of muscle spasms in legs at night.

## 2016-07-17 NOTE — Progress Notes (Signed)
Subjective:  Helen Tyler is a 31 y.o. [id number] at 3161w5d being seen today for her first OB visit. She denies any chronic medical problems or medications prior to pregnancy. TSVD in the past without problems except last pregnancy Dx with GHTN, no meds and delivery at term  She is currently monitored for the following issues for this low-risk pregnancy and has Supervision of normal pregnancy, antepartum on her problem list.  Patient reports leg cramps.  Contractions: Not present. Vag. Bleeding: None.   . Denies leaking of fluid.   The following portions of the patient's history were reviewed and updated as appropriate: allergies, current medications, past family history, past medical history, past social history, past surgical history and problem list. Problem list updated.  Objective:   Vitals:   07/17/16 1010  BP: 108/77  Pulse: 93  Temp: 99.3 F (37.4 C)  Weight: 131 lb (59.4 kg)    Fetal Status: Fetal Heart Rate (bpm): 155         General:  Alert, oriented and cooperative. Patient is in no acute distress.  Skin: Skin is warm and dry. No rash noted.   Cardiovascular: Normal heart rate noted  Respiratory: Normal respiratory effort, no problems with respiration noted  Abdomen: Soft, gravid, appropriate for gestational age. Pain/Pressure: Absent     Pelvic:  Cervical exam performed        Extremities: Normal range of motion.  Edema: None  Mental Status: Normal mood and affect. Normal behavior. Normal judgment and thought content.  Breast sym supple no nipple d/c, masses or adenopathy  Urinalysis: Urine Protein: Negative Urine Glucose: Negative  Assessment and Plan:  Pregnancy: [id number] at 1161w5d  1. Encounter for supervision of normal pregnancy, antepartum, unspecified gravidity Prenatal care and labs reviewed. Declined Flu vaccine Discussed quad screen, considering. BP normal today will follow. - Cytology - PAP - Obstetric Panel, Including HIV - GC/Chlamydia probe amp  ([place])not at [facility] - Culture, OB Urine - Hemoglobinopathy evaluation - Varicella zoster antibody, IgG - ToxASSURE Select 13 (MW), Urine - Wet prep, genital  Preterm labor symptoms and general obstetric precautions including but not limited to vaginal bleeding, contractions, leaking of fluid and fetal movement were reviewed in detail with the patient. Please refer to After Visit Summary for other counseling recommendations.  Return in about 4 weeks (around 08/14/2016) for OB visit.   Hermina StaggersMichael L Ervin, MD

## 2016-07-17 NOTE — Patient Instructions (Signed)
First Trimester of Pregnancy  The first trimester of pregnancy is from week 1 until the end of week 12 (months 1 through 3). A week after a sperm fertilizes an egg, the egg will implant on the wall of the uterus. This embryo will begin to develop into a baby. Genes from you and your partner are forming the baby. The female genes determine whether the baby is a boy or a girl. At 6-8 weeks, the eyes and face are formed, and the heartbeat can be seen on ultrasound. At the end of 12 weeks, all the baby's organs are formed.   Now that you are pregnant, you will want to do everything you can to have a healthy baby. Two of the most important things are to get good prenatal care and to follow your health care provider's instructions. Prenatal care is all the medical care you receive before the baby's birth. This care will help prevent, find, and treat any problems during the pregnancy and childbirth.  BODY CHANGES  Your body goes through many changes during pregnancy. The changes vary from woman to woman.   · You may gain or lose a couple of pounds at first.  · You may feel sick to your stomach (nauseous) and throw up (vomit). If the vomiting is uncontrollable, call your health care provider.  · You may tire easily.  · You may develop headaches that can be relieved by medicines approved by your health care provider.  · You may urinate more often. Painful urination may mean you have a bladder infection.  · You may develop heartburn as a result of your pregnancy.  · You may develop constipation because certain hormones are causing the muscles that push waste through your intestines to slow down.  · You may develop hemorrhoids or swollen, bulging veins (varicose veins).  · Your breasts may begin to grow larger and become tender. Your nipples may stick out more, and the tissue that surrounds them (areola) may become darker.  · Your gums may bleed and may be sensitive to brushing and flossing.   · Dark spots or blotches (chloasma, mask of pregnancy) may develop on your face. This will likely fade after the baby is born.  · Your menstrual periods will stop.  · You may have a loss of appetite.  · You may develop cravings for certain kinds of food.  · You may have changes in your emotions from day to day, such as being excited to be pregnant or being concerned that something may go wrong with the pregnancy and baby.  · You may have more vivid and strange dreams.  · You may have changes in your hair. These can include thickening of your hair, rapid growth, and changes in texture. Some women also have hair loss during or after pregnancy, or hair that feels dry or thin. Your hair will most likely return to normal after your baby is born.  WHAT TO EXPECT AT YOUR PRENATAL VISITS  During a routine prenatal visit:  · You will be weighed to make sure you and the baby are growing normally.  · Your blood pressure will be taken.  · Your abdomen will be measured to track your baby's growth.  · The fetal heartbeat will be listened to starting around week 10 or 12 of your pregnancy.  · Test results from any previous visits will be discussed.  Your health care provider may ask you:  · How you are feeling.  · If you   are feeling the baby move.  · If you have had any abnormal symptoms, such as leaking fluid, bleeding, severe headaches, or abdominal cramping.  · If you are using any tobacco products, including cigarettes, chewing tobacco, and electronic cigarettes.  · If you have any questions.  Other tests that may be performed during your first trimester include:  · Blood tests to find your blood type and to check for the presence of any previous infections. They will also be used to check for low iron levels (anemia) and Rh antibodies. Later in the pregnancy, blood tests for diabetes will be done along with other tests if problems develop.  · Urine tests to check for infections, diabetes, or protein in the urine.   · An ultrasound to confirm the proper growth and development of the baby.  · An amniocentesis to check for possible genetic problems.  · Fetal screens for spina bifida and Down syndrome.  · You may need other tests to make sure you and the baby are doing well.  · HIV (human immunodeficiency virus) testing. Routine prenatal testing includes screening for HIV, unless you choose not to have this test.  HOME CARE INSTRUCTIONS   Medicines  · Follow your health care provider's instructions regarding medicine use. Specific medicines may be either safe or unsafe to take during pregnancy.  · Take your prenatal vitamins as directed.  · If you develop constipation, try taking a stool softener if your health care provider approves.  Diet  · Eat regular, well-balanced meals. Choose a variety of foods, such as meat or vegetable-based protein, fish, milk and low-fat dairy products, vegetables, fruits, and whole grain breads and cereals. Your health care provider will help you determine the amount of weight gain that is right for you.  · Avoid raw meat and uncooked cheese. These carry germs that can cause birth defects in the baby.  · Eating four or five small meals rather than three large meals a day may help relieve nausea and vomiting. If you start to feel nauseous, eating a few soda crackers can be helpful. Drinking liquids between meals instead of during meals also seems to help nausea and vomiting.  · If you develop constipation, eat more high-fiber foods, such as fresh vegetables or fruit and whole grains. Drink enough fluids to keep your urine clear or pale yellow.  Activity and Exercise  · Exercise only as directed by your health care provider. Exercising will help you:    Control your weight.    Stay in shape.    Be prepared for labor and delivery.  · Experiencing pain or cramping in the lower abdomen or low back is a good sign that you should stop exercising. Check with your health care provider  before continuing normal exercises.  · Try to avoid standing for long periods of time. Move your legs often if you must stand in one place for a long time.  · Avoid heavy lifting.  · Wear low-heeled shoes, and practice good posture.  · You may continue to have sex unless your health care provider directs you otherwise.  Relief of Pain or Discomfort  · Wear a good support bra for breast tenderness.    · Take warm sitz baths to soothe any pain or discomfort caused by hemorrhoids. Use hemorrhoid cream if your health care provider approves.    · Rest with your legs elevated if you have leg cramps or low back pain.  · If you develop varicose veins in your   legs, wear support hose. Elevate your feet for 15 minutes, 3-4 times a day. Limit salt in your diet.  Prenatal Care  · Schedule your prenatal visits by the twelfth week of pregnancy. They are usually scheduled monthly at first, then more often in the last 2 months before delivery.  · Write down your questions. Take them to your prenatal visits.  · Keep all your prenatal visits as directed by your health care provider.  Safety  · Wear your seat belt at all times when driving.  · Make a list of emergency phone numbers, including numbers for family, friends, the hospital, and police and fire departments.  General Tips  · Ask your health care provider for a referral to a local prenatal education class. Begin classes no later than at the beginning of month 6 of your pregnancy.  · Ask for help if you have counseling or nutritional needs during pregnancy. Your health care provider can offer advice or refer you to specialists for help with various needs.  · Do not use hot tubs, steam rooms, or saunas.  · Do not douche or use tampons or scented sanitary pads.  · Do not cross your legs for long periods of time.  · Avoid cat litter boxes and soil used by cats. These carry germs that can cause birth defects in the baby and possibly loss of the fetus by miscarriage or stillbirth.   · Avoid all smoking, herbs, alcohol, and medicines not prescribed by your health care provider. Chemicals in these affect the formation and growth of the baby.  · Do not use any tobacco products, including cigarettes, chewing tobacco, and electronic cigarettes. If you need help quitting, ask your health care provider. You may receive counseling support and other resources to help you quit.  · Schedule a dentist appointment. At home, brush your teeth with a soft toothbrush and be gentle when you floss.  SEEK MEDICAL CARE IF:   · You have dizziness.  · You have mild pelvic cramps, pelvic pressure, or nagging pain in the abdominal area.  · You have persistent nausea, vomiting, or diarrhea.  · You have a bad smelling vaginal discharge.  · You have pain with urination.  · You notice increased swelling in your face, hands, legs, or ankles.  SEEK IMMEDIATE MEDICAL CARE IF:   · You have a fever.  · You are leaking fluid from your vagina.  · You have spotting or bleeding from your vagina.  · You have severe abdominal cramping or pain.  · You have rapid weight gain or loss.  · You vomit blood or material that looks like coffee grounds.  · You are exposed to German measles and have never had them.  · You are exposed to fifth disease or chickenpox.  · You develop a severe headache.  · You have shortness of breath.  · You have any kind of trauma, such as from a fall or a car accident.     This information is not intended to replace advice given to you by your health care provider. Make sure you discuss any questions you have with your health care provider.     Document Released: 05/28/2001 Document Revised: 06/24/2014 Document Reviewed: 04/13/2013  Elsevier Interactive Patient Education ©2017 [employer]

## 2016-07-17 NOTE — Addendum Note (Signed)
Addended by: Hermina StaggersERVIN, Ronit Cranfield L on: 07/17/2016 11:14 AM   Modules accepted: Orders

## 2016-07-18 LAB — GC/CHLAMYDIA PROBE AMP (~~LOC~~) NOT AT ARMC
CHLAMYDIA, DNA PROBE: NEGATIVE
NEISSERIA GONORRHEA: NEGATIVE

## 2016-07-18 LAB — CERVICOVAGINAL ANCILLARY ONLY
Bacterial vaginitis: POSITIVE — AB
Candida vaginitis: NEGATIVE

## 2016-07-19 ENCOUNTER — Other Ambulatory Visit: Payer: Self-pay | Admitting: *Deleted

## 2016-07-19 ENCOUNTER — Other Ambulatory Visit: Payer: Self-pay

## 2016-07-19 DIAGNOSIS — B9689 Other specified bacterial agents as the cause of diseases classified elsewhere: Secondary | ICD-10-CM

## 2016-07-19 DIAGNOSIS — N760 Acute vaginitis: Principal | ICD-10-CM

## 2016-07-19 LAB — OBSTETRIC PANEL, INCLUDING HIV
Antibody Screen: NEGATIVE
BASOS ABS: 0 10*3/uL (ref 0.0–0.2)
Basos: 0 %
EOS (ABSOLUTE): 0.2 10*3/uL (ref 0.0–0.4)
Eos: 2 %
HIV SCREEN 4TH GENERATION: NONREACTIVE
Hematocrit: 33.7 % — ABNORMAL LOW (ref 34.0–46.6)
Hemoglobin: 10.8 g/dL — ABNORMAL LOW (ref 11.1–15.9)
Hepatitis B Surface Ag: NEGATIVE
Immature Grans (Abs): 0 10*3/uL (ref 0.0–0.1)
Immature Granulocytes: 0 %
LYMPHS ABS: 2 10*3/uL (ref 0.7–3.1)
Lymphs: 21 %
MCH: 26.9 pg (ref 26.6–33.0)
MCHC: 32 g/dL (ref 31.5–35.7)
MCV: 84 fL (ref 79–97)
MONOCYTES: 5 %
Monocytes Absolute: 0.5 10*3/uL (ref 0.1–0.9)
NEUTROS ABS: 7 10*3/uL (ref 1.4–7.0)
Neutrophils: 72 %
Platelets: 295 10*3/uL (ref 150–379)
RBC: 4.02 x10E6/uL (ref 3.77–5.28)
RDW: 15 % (ref 12.3–15.4)
RPR Ser Ql: NONREACTIVE
Rh Factor: POSITIVE
Rubella Antibodies, IGG: 17.8 index (ref >0.99)
WBC: 9.7 10*3/uL (ref 3.4–10.8)

## 2016-07-19 LAB — VARICELLA ZOSTER ANTIBODY, IGG: VARICELLA: 2279 {index} (ref >165)

## 2016-07-19 LAB — CYTOLOGY - PAP
Diagnosis: NEGATIVE
HPV: NOT DETECTED

## 2016-07-19 LAB — CULTURE, OB URINE

## 2016-07-19 LAB — HEMOGLOBINOPATHY EVALUATION
HEMOGLOBIN F QUANTITATION: 0 % (ref 0.0–2.0)
HGB C: 0 %
HGB S: 0 %
HGB VARIANT: 0 %
Hemoglobin A2 Quantitation: 2.3 % (ref 1.8–3.2)
Hgb A: 97.7 % (ref 96.4–98.8)

## 2016-07-19 LAB — URINE CULTURE, OB REFLEX

## 2016-07-19 MED ORDER — METRONIDAZOLE 0.75 % VA GEL
1.0000 | Freq: Every day | VAGINAL | 0 refills | Status: DC
Start: 2016-07-19 — End: 2016-09-24

## 2016-07-22 ENCOUNTER — Other Ambulatory Visit: Payer: Self-pay

## 2016-07-22 DIAGNOSIS — O219 Vomiting of pregnancy, unspecified: Secondary | ICD-10-CM

## 2016-07-22 MED ORDER — DOXYLAMINE-PYRIDOXINE 10-10 MG PO TBEC: 2.0000 | DELAYED_RELEASE_TABLET | Freq: Every day | ORAL | 5 refills | Status: DC

## 2016-07-22 NOTE — Progress Notes (Signed)
PT c/o N&V. Diclegis sent to pharmacy per protocol.

## 2016-07-24 LAB — TOXASSURE SELECT 13 (MW), URINE

## 2016-08-14 ENCOUNTER — Ambulatory Visit (INDEPENDENT_AMBULATORY_CARE_PROVIDER_SITE_OTHER): Payer: Medicaid Other | Admitting: Obstetrics and Gynecology

## 2016-08-14 ENCOUNTER — Encounter: Payer: Self-pay | Admitting: *Deleted

## 2016-08-14 VITALS — BP 127/85 | HR 95 | Wt 139.2 lb

## 2016-08-14 DIAGNOSIS — Z3480 Encounter for supervision of other normal pregnancy, unspecified trimester: Secondary | ICD-10-CM

## 2016-08-14 DIAGNOSIS — Z8759 Personal history of other complications of pregnancy, childbirth and the puerperium: Secondary | ICD-10-CM

## 2016-08-14 DIAGNOSIS — Z3482 Encounter for supervision of other normal pregnancy, second trimester: Secondary | ICD-10-CM

## 2016-08-14 MED ORDER — ONDANSETRON 4 MG PO TBDP: 4.0000 mg | ORAL_TABLET | Freq: Four times a day (QID) | ORAL | 0 refills | Status: DC | PRN

## 2016-08-14 MED ORDER — ASPIRIN EC 81 MG PO TBEC: 81.0000 mg | DELAYED_RELEASE_TABLET | Freq: Every day | ORAL | 2 refills | Status: DC

## 2016-08-14 MED ORDER — DOXYLAMINE-PYRIDOXINE 10-10 MG PO TBEC: DELAYED_RELEASE_TABLET | ORAL | 6 refills | Status: DC

## 2016-08-14 NOTE — Progress Notes (Signed)
Urine has trace hgb

## 2016-08-14 NOTE — Progress Notes (Signed)
   PRENATAL VISIT NOTE  Subjective:  Helen Tyler is a 31 y.o. [id number] at 663w5d being seen today for ongoing prenatal care.  She is currently monitored for the following issues for this low-risk pregnancy and has Supervision of normal pregnancy, antepartum and History of gestational hypertension on her problem list.  Patient reports no complaints.  Contractions: Not present. Vag. Bleeding: None.  Movement: Present. Denies leaking of fluid.   The following portions of the patient's history were reviewed and updated as appropriate: allergies, current medications, past family history, past medical history, past social history, past surgical history and problem list. Problem list updated.  Objective:   Vitals:   08/14/16 0943  BP: 127/85  Pulse: 95  Weight: 139 lb 3.2 oz (63.1 kg)    Fetal Status: Fetal Heart Rate (bpm): 153   Movement: Present     General:  Alert, oriented and cooperative. Patient is in no acute distress.  Skin: Skin is warm and dry. No rash noted.   Cardiovascular: Normal heart rate noted  Respiratory: Normal respiratory effort, no problems with respiration noted  Abdomen: Soft, gravid, appropriate for gestational age. Pain/Pressure: Absent     Pelvic:  Cervical exam deferred        Extremities: Normal range of motion.  Edema: None  Mental Status: Normal mood and affect. Normal behavior. Normal judgment and thought content.   Assessment and Plan:  Pregnancy: [id number] at 6463w5d  1. Supervision of other normal pregnancy, antepartum Patient is doing well. She reports persistent nausea and emesis and has not been able to pick up diclegis medication. Rx zofran provided Patient is not interested in quad screen today  - US MFM OB COMP + 14 WK; Future - Culture, OB Urine  2. History of gestational hypertension Encouraged patient to take daily ASA  General obstetric precautions including but not limited to vaginal bleeding, contractions, leaking of fluid and fetal  movement were reviewed in detail with the patient. Please refer to After Visit Summary for other counseling recommendations.  Return in about 4 weeks (around 09/11/2016) for ROB.   Catalina AntiguaPeggy Frances Ambrosino, MD

## 2016-08-16 LAB — CULTURE, OB URINE

## 2016-08-16 LAB — URINE CULTURE, OB REFLEX

## 2016-08-21 ENCOUNTER — Other Ambulatory Visit: Payer: Self-pay | Admitting: Obstetrics and Gynecology

## 2016-08-21 ENCOUNTER — Ambulatory Visit (HOSPITAL_COMMUNITY)
Admission: RE | Admit: 2016-08-21 | Discharge: 2016-08-21 | Disposition: A | Discharge status: Home / Self Care | Payer: Medicaid Other | Source: Ambulatory Visit | Attending: Obstetrics and Gynecology | Admitting: Obstetrics and Gynecology

## 2016-08-21 DIAGNOSIS — Z3480 Encounter for supervision of other normal pregnancy, unspecified trimester: Secondary | ICD-10-CM

## 2016-08-21 DIAGNOSIS — Z3A18 18 weeks gestation of pregnancy: Secondary | ICD-10-CM

## 2016-08-21 DIAGNOSIS — Z363 Encounter for antenatal screening for malformations: Secondary | ICD-10-CM | POA: Insufficient documentation

## 2016-08-22 ENCOUNTER — Encounter: Payer: Self-pay | Admitting: Obstetrics & Gynecology

## 2016-08-22 DIAGNOSIS — O4420 Partial placenta previa NOS or without hemorrhage, unspecified trimester: Secondary | ICD-10-CM | POA: Insufficient documentation

## 2016-09-06 ENCOUNTER — Telehealth: Payer: Self-pay | Admitting: *Deleted

## 2016-09-06 NOTE — Telephone Encounter (Signed)
Please provide refill

## 2016-09-06 NOTE — Telephone Encounter (Signed)
Patient is calling to request a refill of Zofran.

## 2016-09-09 ENCOUNTER — Other Ambulatory Visit: Payer: Self-pay | Admitting: *Deleted

## 2016-09-09 MED ORDER — ONDANSETRON 4 MG PO TBDP: 4.0000 mg | ORAL_TABLET | Freq: Four times a day (QID) | ORAL | 0 refills | Status: DC | PRN

## 2016-09-09 NOTE — Telephone Encounter (Signed)
Refill ordered

## 2016-09-11 ENCOUNTER — Encounter: Payer: Medicaid Other | Admitting: Obstetrics and Gynecology

## 2016-09-24 ENCOUNTER — Encounter: Payer: Self-pay | Admitting: *Deleted

## 2016-09-24 ENCOUNTER — Ambulatory Visit (INDEPENDENT_AMBULATORY_CARE_PROVIDER_SITE_OTHER): Payer: Medicaid Other | Admitting: Obstetrics and Gynecology

## 2016-09-24 VITALS — BP 115/79 | HR 97 | Wt 143.0 lb

## 2016-09-24 DIAGNOSIS — Z3009 Encounter for other general counseling and advice on contraception: Secondary | ICD-10-CM

## 2016-09-24 DIAGNOSIS — Z8759 Personal history of other complications of pregnancy, childbirth and the puerperium: Secondary | ICD-10-CM

## 2016-09-24 DIAGNOSIS — O4420 Partial placenta previa NOS or without hemorrhage, unspecified trimester: Secondary | ICD-10-CM

## 2016-09-24 DIAGNOSIS — Z3480 Encounter for supervision of other normal pregnancy, unspecified trimester: Secondary | ICD-10-CM

## 2016-09-24 MED ORDER — ONDANSETRON HCL 4 MG PO TABS: 4.0000 mg | ORAL_TABLET | Freq: Three times a day (TID) | ORAL | 2 refills | Status: DC | PRN

## 2016-09-24 NOTE — Progress Notes (Signed)
Patient reports good fetal movement, denies pain. 

## 2016-09-24 NOTE — Progress Notes (Signed)
Subjective:  Helen Tyler is a 31 y.o. [phone] at [redacted]w[redacted]d being seen today for ongoing prenatal care.  She is currently monitored for the following issues for this low-risk pregnancy and has Supervision of normal pregnancy, antepartum; History of gestational hypertension; Marginal placenta previa; and Unwanted fertility on her problem list.  Patient reports no complaints.  Contractions: Not present. Vag. Bleeding: None.  Movement: Present. Denies leaking of fluid.   The following portions of the patient's history were reviewed and updated as appropriate: allergies, current medications, past family history, past medical history, past social history, past surgical history and problem list. Problem list updated.  Objective:   Vitals:   09/24/16 1123  BP: 115/79  Pulse: 97  Weight: 143 lb (64.9 kg)    Fetal Status: Fetal Heart Rate (bpm): 147   Movement: Present     General:  Alert, oriented and cooperative. Patient is in no acute distress.  Skin: Skin is warm and dry. No rash noted.   Cardiovascular: Normal heart rate noted  Respiratory: Normal respiratory effort, no problems with respiration noted  Abdomen: Soft, gravid, appropriate for gestational age. Pain/Pressure: Absent     Pelvic:  Cervical exam deferred        Extremities: Normal range of motion.  Edema: None  Mental Status: Normal mood and affect. Normal behavior. Normal judgment and thought content.   Urinalysis:      Assessment and Plan:  Pregnancy: [id number] at [redacted]w[redacted]d  1. Supervision of other normal pregnancy, antepartum Some N/V Zofran helps Glucola next visit - ondansetron (ZOFRAN) 4 MG tablet; Take 1 tablet (4 mg total) by mouth every 8 (eight) hours as needed for nausea or vomiting.  Dispense: 20 tablet; Refill: 2  2. Marginal placenta previa  - [place] MFM OB FOLLOW UP; Future  3. History of gestational hypertension Encouraged to take BASA qd  4. Unwanted fertility Needs to sign BTL papers at next  visit  Preterm labor symptoms and general obstetric precautions including but not limited to vaginal bleeding, contractions, leaking of fluid and fetal movement were reviewed in detail with the patient. Please refer to After Visit Summary for other counseling recommendations.  Return in about 4 weeks (around 10/22/2016).   Hermina Staggers, MD

## 2016-09-24 NOTE — Patient Instructions (Signed)

## 2016-10-08 ENCOUNTER — Ambulatory Visit (HOSPITAL_COMMUNITY)
Admission: RE | Admit: 2016-10-08 | Discharge: 2016-10-08 | Disposition: A | Discharge status: Home / Self Care | Payer: Medicaid Other | Source: Ambulatory Visit | Attending: Obstetrics and Gynecology | Admitting: Obstetrics and Gynecology

## 2016-10-08 DIAGNOSIS — Z362 Encounter for other antenatal screening follow-up: Secondary | ICD-10-CM | POA: Insufficient documentation

## 2016-10-08 DIAGNOSIS — Z3A25 25 weeks gestation of pregnancy: Secondary | ICD-10-CM | POA: Diagnosis not present

## 2016-10-08 DIAGNOSIS — O09292 Supervision of pregnancy with other poor reproductive or obstetric history, second trimester: Secondary | ICD-10-CM | POA: Diagnosis not present

## 2016-10-08 DIAGNOSIS — O4420 Partial placenta previa NOS or without hemorrhage, unspecified trimester: Secondary | ICD-10-CM

## 2016-10-12 ENCOUNTER — Other Ambulatory Visit: Payer: Self-pay | Admitting: Obstetrics and Gynecology

## 2016-10-22 ENCOUNTER — Encounter: Payer: Self-pay | Admitting: Obstetrics

## 2016-10-22 ENCOUNTER — Other Ambulatory Visit (HOSPITAL_COMMUNITY)
Admission: RE | Admit: 2016-10-22 | Discharge: 2016-10-22 | Disposition: A | Discharge status: Home / Self Care | Payer: Medicaid Other | Source: Ambulatory Visit | Attending: Obstetrics and Gynecology | Admitting: Obstetrics and Gynecology

## 2016-10-22 ENCOUNTER — Ambulatory Visit (INDEPENDENT_AMBULATORY_CARE_PROVIDER_SITE_OTHER): Payer: Medicaid Other | Admitting: Obstetrics and Gynecology

## 2016-10-22 ENCOUNTER — Other Ambulatory Visit: Payer: Medicaid Other

## 2016-10-22 VITALS — BP 119/77 | HR 93 | Wt 152.8 lb

## 2016-10-22 DIAGNOSIS — Z3A27 27 weeks gestation of pregnancy: Secondary | ICD-10-CM | POA: Insufficient documentation

## 2016-10-22 DIAGNOSIS — Z3482 Encounter for supervision of other normal pregnancy, second trimester: Secondary | ICD-10-CM | POA: Diagnosis not present

## 2016-10-22 DIAGNOSIS — Z3480 Encounter for supervision of other normal pregnancy, unspecified trimester: Secondary | ICD-10-CM

## 2016-10-22 DIAGNOSIS — O4422 Partial placenta previa NOS or without hemorrhage, second trimester: Secondary | ICD-10-CM

## 2016-10-22 DIAGNOSIS — Z8759 Personal history of other complications of pregnancy, childbirth and the puerperium: Secondary | ICD-10-CM

## 2016-10-22 DIAGNOSIS — O4420 Partial placenta previa NOS or without hemorrhage, unspecified trimester: Secondary | ICD-10-CM

## 2016-10-22 MED ORDER — ONDANSETRON HCL 4 MG PO TABS: 4.0000 mg | ORAL_TABLET | Freq: Three times a day (TID) | ORAL | 2 refills | Status: DC | PRN

## 2016-10-22 NOTE — Progress Notes (Signed)
Patient thinks she has yeast infection- she has treated with I day treatment- but she would like to be checked. Pressure comes and goes- she notices it the most when she is on her feet at work.

## 2016-10-22 NOTE — Progress Notes (Signed)
   PRENATAL VISIT NOTE  Subjective:  Helen Tyler is a 31 y.o. [id number] at 281w4d being seen today for ongoing prenatal care.  She is currently monitored for the following issues for this low-risk pregnancy and has Supervision of normal pregnancy, antepartum; History of gestational hypertension; Marginal placenta previa; and Unwanted fertility on her problem list.  Patient reports vaginal pruritis with discharge.  Contractions: Not present. Vag. Bleeding: None.  Movement: Present. Denies leaking of fluid.   The following portions of the patient's history were reviewed and updated as appropriate: allergies, current medications, past family history, past medical history, past social history, past surgical history and problem list. Problem list updated.  Objective:   Vitals:   10/22/16 0909  BP: 119/77  Pulse: 93  Weight: 152 lb 12.8 oz (69.3 kg)    Fetal Status: Fetal Heart Rate (bpm): 145 Fundal Height: 28 cm Movement: Present     General:  Alert, oriented and cooperative. Patient is in no acute distress.  Skin: Skin is warm and dry. No rash noted.   Cardiovascular: Normal heart rate noted  Respiratory: Normal respiratory effort, no problems with respiration noted  Abdomen: Soft, gravid, appropriate for gestational age. Pain/Pressure: Present     Pelvic:  Cervical exam deferred        Extremities: Normal range of motion.  Edema: None  Mental Status: Normal mood and affect. Normal behavior. Normal judgment and thought content.   Assessment and Plan:  Pregnancy: [id number] at 281w4d  1. Supervision of other normal pregnancy, antepartum Patient is doing well Third trimester labs and glucola today Wet prep collected - Glucose Tolerance, 2 Hours w/1 Hour - CBC - HIV antibody (with reflex) - RPR - ondansetron (ZOFRAN) 4 MG tablet; Take 1 tablet (4 mg total) by mouth every 8 (eight) hours as needed for nausea or vomiting.  Dispense: 20 tablet; Refill: 2 - Cervicovaginal ancillary  only  2. History of gestational hypertension Patient has not been taking ASA  3. Marginal placenta previa Resolved in April ultrasound  Preterm labor symptoms and general obstetric precautions including but not limited to vaginal bleeding, contractions, leaking of fluid and fetal movement were reviewed in detail with the patient. Please refer to After Visit Summary for other counseling recommendations.  Return in about 2 weeks (around 11/05/2016) for ROB.   Kentrell Hallahan, Gigi GinPeggy, MD

## 2016-10-23 ENCOUNTER — Telehealth: Payer: Self-pay | Admitting: *Deleted

## 2016-10-23 LAB — CBC
Hematocrit: 26.9 % — ABNORMAL LOW (ref 34.0–46.6)
Hemoglobin: 8.7 g/dL — ABNORMAL LOW (ref 11.1–15.9)
MCH: 27.3 pg (ref 26.6–33.0)
MCHC: 32.3 g/dL (ref 31.5–35.7)
MCV: 84 fL (ref 79–97)
Platelets: 284 10*3/uL (ref 150–379)
RBC: 3.19 x10E6/uL — ABNORMAL LOW (ref 3.77–5.28)
RDW: 13.6 % (ref 12.3–15.4)
WBC: 9.2 10*3/uL (ref 3.4–10.8)

## 2016-10-23 LAB — GLUCOSE TOLERANCE, 2 HOURS W/ 1HR
GLUCOSE, 1 HOUR: 137 mg/dL (ref 65–179)
GLUCOSE, 2 HOUR: 109 mg/dL (ref 65–152)
Glucose, Fasting: 75 mg/dL (ref 65–91)

## 2016-10-23 LAB — CERVICOVAGINAL ANCILLARY ONLY
Bacterial vaginitis: NEGATIVE
Candida vaginitis: POSITIVE — AB
TRICH (WINDOWPATH): NEGATIVE

## 2016-10-23 LAB — HIV ANTIBODY (ROUTINE TESTING W REFLEX): HIV SCREEN 4TH GENERATION: NONREACTIVE

## 2016-10-23 LAB — RPR: RPR: NONREACTIVE

## 2016-10-23 NOTE — Telephone Encounter (Signed)
Pt called to office for lab results. Pt made aware of glucose testing and all other bloodwork. Pt was made aware that her Hgb levels are low. Pt was advised to start Iron OTC.  Pt states that she had same problem with previous pregnancy. Pt made aware that further recommendations will be reviewed with provider.  Pt made aware that vaginal Swab results are not yet resulted. Pt advised to call office tomorrow and will check for results.   Please advise on any further needs regarding Hgb level.

## 2016-10-24 ENCOUNTER — Other Ambulatory Visit: Payer: Self-pay | Admitting: Obstetrics and Gynecology

## 2016-10-24 MED ORDER — FLUCONAZOLE 150 MG PO TABS: 150.0000 mg | ORAL_TABLET | Freq: Once | ORAL | 0 refills | Status: AC

## 2016-10-24 NOTE — Telephone Encounter (Signed)
-----   Message from Catalina AntiguaPeggy Constant, MD sent at 10/24/2016 11:38 AM EDT ----- Please inform patient of yeast infection. Rx has been e-prescribed. Iron is all the patient needs for anemia right now  [employer] Morgan Energy[employer]eggy

## 2016-10-24 NOTE — Telephone Encounter (Signed)
Pt made aware of results and recommendations.

## 2016-11-05 ENCOUNTER — Ambulatory Visit (INDEPENDENT_AMBULATORY_CARE_PROVIDER_SITE_OTHER): Payer: Medicaid Other | Admitting: Obstetrics & Gynecology

## 2016-11-05 ENCOUNTER — Encounter: Payer: Self-pay | Admitting: Obstetrics & Gynecology

## 2016-11-05 DIAGNOSIS — Z3480 Encounter for supervision of other normal pregnancy, unspecified trimester: Secondary | ICD-10-CM

## 2016-11-05 DIAGNOSIS — Z3482 Encounter for supervision of other normal pregnancy, second trimester: Secondary | ICD-10-CM

## 2016-11-05 NOTE — Progress Notes (Signed)
Patient reports good fetal movement and reports lower pelvic pain earlier this morning. Pt denies bleeding.

## 2016-11-05 NOTE — Patient Instructions (Signed)
Third Trimester of Pregnancy The third trimester is from week 28 through week 40 (months 7 through 9). The third trimester is a time when the unborn baby (fetus) is growing rapidly. At the end of the ninth month, the fetus is about 20 inches in length and weighs 6-10 pounds. Body changes during your third trimester Your body will continue to go through many changes during pregnancy. The changes vary from woman to woman. During the third trimester:  Your weight will continue to increase. You can expect to gain 25-35 pounds (11-16 kg) by the end of the pregnancy.  You may begin to get stretch marks on your hips, abdomen, and breasts.  You may urinate more often because the fetus is moving lower into your pelvis and pressing on your bladder.  You may develop or continue to have heartburn. This is caused by increased hormones that slow down muscles in the digestive tract.  You may develop or continue to have constipation because increased hormones slow digestion and cause the muscles that push waste through your intestines to relax.  You may develop hemorrhoids. These are swollen veins (varicose veins) in the rectum that can itch or be painful.  You may develop swollen, bulging veins (varicose veins) in your legs.  You may have increased body aches in the pelvis, back, or thighs. This is due to weight gain and increased hormones that are relaxing your joints.  You may have changes in your hair. These can include thickening of your hair, rapid growth, and changes in texture. Some women also have hair loss during or after pregnancy, or hair that feels dry or thin. Your hair will most likely return to normal after your baby is born.  Your breasts will continue to grow and they will continue to become tender. A yellow fluid (colostrum) may leak from your breasts. This is the first milk you are producing for your baby.  Your belly button may stick out.  You may notice more swelling in your hands,  face, or ankles.  You may have increased tingling or numbness in your hands, arms, and legs. The skin on your belly may also feel numb.  You may feel short of breath because of your expanding uterus.  You may have more problems sleeping. This can be caused by the size of your belly, increased need to urinate, and an increase in your body's metabolism.  You may notice the fetus "dropping," or moving lower in your abdomen (lightening).  You may have increased vaginal discharge.  You may notice your joints feel loose and you may have pain around your pelvic bone.  What to expect at prenatal visits You will have prenatal exams every 2 weeks until week 36. Then you will have weekly prenatal exams. During a routine prenatal visit:  You will be weighed to make sure you and the baby are growing normally.  Your blood pressure will be taken.  Your abdomen will be measured to track your baby's growth.  The fetal heartbeat will be listened to.  Any test results from the previous visit will be discussed.  You may have a cervical check near your due date to see if your cervix has softened or thinned (effaced).  You will be tested for Group B streptococcus. This happens between 35 and 37 weeks.  Your health care provider may ask you:  What your birth plan is.  How you are feeling.  If you are feeling the baby move.  If you have had   any abnormal symptoms, such as leaking fluid, bleeding, severe headaches, or abdominal cramping.  If you are using any tobacco products, including cigarettes, chewing tobacco, and electronic cigarettes.  If you have any questions.  Other tests or screenings that may be performed during your third trimester include:  Blood tests that check for low iron levels (anemia).  Fetal testing to check the health, activity level, and growth of the fetus. Testing is done if you have certain medical conditions or if there are problems during the  pregnancy.  Nonstress test (NST). This test checks the health of your baby to make sure there are no signs of problems, such as the baby not getting enough oxygen. During this test, a belt is placed around your belly. The baby is made to move, and its heart rate is monitored during movement.  What is false labor? False labor is a condition in which you feel small, irregular tightenings of the muscles in the womb (contractions) that usually go away with rest, changing position, or drinking water. These are called Braxton Hicks contractions. Contractions may last for hours, days, or even weeks before true labor sets in. If contractions come at regular intervals, become more frequent, increase in intensity, or become painful, you should see your health care provider. What are the signs of labor?  Abdominal cramps.  Regular contractions that start at 10 minutes apart and become stronger and more frequent with time.  Contractions that start on the top of the uterus and spread down to the lower abdomen and back.  Increased pelvic pressure and dull back pain.  A watery or bloody mucus discharge that comes from the vagina.  Leaking of amniotic fluid. This is also known as your "water breaking." It could be a slow trickle or a gush. Let your health care provider know if it has a color or strange odor. If you have any of these signs, call your health care provider right away, even if it is before your due date. Follow these instructions at home: Medicines  Follow your health care provider's instructions regarding medicine use. Specific medicines may be either safe or unsafe to take during pregnancy.  Take a prenatal vitamin that contains at least 600 micrograms (mcg) of folic acid.  If you develop constipation, try taking a stool softener if your health care provider approves. Eating and drinking  Eat a balanced diet that includes fresh fruits and vegetables, whole grains, good sources of protein  such as meat, eggs, or tofu, and low-fat dairy. Your health care provider will help you determine the amount of weight gain that is right for you.  Avoid raw meat and uncooked cheese. These carry germs that can cause birth defects in the baby.  If you have low calcium intake from food, talk to your health care provider about whether you should take a daily calcium supplement.  Eat four or five small meals rather than three large meals a day.  Limit foods that are high in fat and processed sugars, such as fried and sweet foods.  To prevent constipation: ? Drink enough fluid to keep your urine clear or pale yellow. ? Eat foods that are high in fiber, such as fresh fruits and vegetables, whole grains, and beans. Activity  Exercise only as directed by your health care provider. Most women can continue their usual exercise routine during pregnancy. Try to exercise for 30 minutes at least 5 days a week. Stop exercising if you experience uterine contractions.  Avoid heavy   lifting.  Do not exercise in extreme heat or humidity, or at high altitudes.  Wear low-heel, comfortable shoes.  Practice good posture.  You may continue to have sex unless your health care provider tells you otherwise. Relieving pain and discomfort  Take frequent breaks and rest with your legs elevated if you have leg cramps or low back pain.  Take warm sitz baths to soothe any pain or discomfort caused by hemorrhoids. Use hemorrhoid cream if your health care provider approves.  Wear a good support bra to prevent discomfort from breast tenderness.  If you develop varicose veins: ? Wear support pantyhose or compression stockings as told by your healthcare provider. ? Elevate your feet for 15 minutes, 3-4 times a day. Prenatal care  Write down your questions. Take them to your prenatal visits.  Keep all your prenatal visits as told by your health care provider. This is important. Safety  Wear your seat belt at  all times when driving.  Make a list of emergency phone numbers, including numbers for family, friends, the hospital, and police and fire departments. General instructions  Avoid cat litter boxes and soil used by cats. These carry germs that can cause birth defects in the baby. If you have a cat, ask someone to clean the litter box for you.  Do not travel far distances unless it is absolutely necessary and only with the approval of your health care provider.  Do not use hot tubs, steam rooms, or saunas.  Do not drink alcohol.  Do not use any products that contain nicotine or tobacco, such as cigarettes and e-cigarettes. If you need help quitting, ask your health care provider.  Do not use any medicinal herbs or unprescribed drugs. These chemicals affect the formation and growth of the baby.  Do not douche or use tampons or scented sanitary pads.  Do not cross your legs for long periods of time.  To prepare for the arrival of your baby: ? Take prenatal classes to understand, practice, and ask questions about labor and delivery. ? Make a trial run to the hospital. ? Visit the hospital and tour the maternity area. ? Arrange for maternity or paternity leave through employers. ? Arrange for family and friends to take care of pets while you are in the hospital. ? Purchase a rear-facing car seat and make sure you know how to install it in your car. ? Pack your hospital bag. ? Prepare the baby's nursery. Make sure to remove all pillows and stuffed animals from the baby's crib to prevent suffocation.  Visit your dentist if you have not gone during your pregnancy. Use a soft toothbrush to brush your teeth and be gentle when you floss. Contact a health care provider if:  You are unsure if you are in labor or if your water has broken.  You become dizzy.  You have mild pelvic cramps, pelvic pressure, or nagging pain in your abdominal area.  You have lower back pain.  You have persistent  nausea, vomiting, or diarrhea.  You have an unusual or bad smelling vaginal discharge.  You have pain when you urinate. Get help right away if:  Your water breaks before 37 weeks.  You have regular contractions less than 5 minutes apart before 37 weeks.  You have a fever.  You are leaking fluid from your vagina.  You have spotting or bleeding from your vagina.  You have severe abdominal pain or cramping.  You have rapid weight loss or weight gain.    You have shortness of breath with chest pain.  You notice sudden or extreme swelling of your face, hands, ankles, feet, or legs.  Your baby makes fewer than 10 movements in 2 hours.  You have severe headaches that do not go away when you take medicine.  You have vision changes. Summary  The third trimester is from week 28 through week 40, months 7 through 9. The third trimester is a time when the unborn baby (fetus) is growing rapidly.  During the third trimester, your discomfort may increase as you and your baby continue to gain weight. You may have abdominal, leg, and back pain, sleeping problems, and an increased need to urinate.  During the third trimester your breasts will keep growing and they will continue to become tender. A yellow fluid (colostrum) may leak from your breasts. This is the first milk you are producing for your baby.  False labor is a condition in which you feel small, irregular tightenings of the muscles in the womb (contractions) that eventually go away. These are called Braxton Hicks contractions. Contractions may last for hours, days, or even weeks before true labor sets in.  Signs of labor can include: abdominal cramps; regular contractions that start at 10 minutes apart and become stronger and more frequent with time; watery or bloody mucus discharge that comes from the vagina; increased pelvic pressure and dull back pain; and leaking of amniotic fluid. This information is not intended to replace advice  given to you by your health care provider. Make sure you discuss any questions you have with your health care provider. Document Released: 05/28/2001 Document Revised: 11/09/2015 Document Reviewed: 08/04/2012 Elsevier Interactive Patient Education  2017 [employer]  

## 2016-11-05 NOTE — Progress Notes (Signed)
   PRENATAL VISIT NOTE  Subjective:  Dessa PhiKimberly L Kowalke is a 31 y.o. [phone] at 3179w4d being seen today for ongoing prenatal care.  She is currently monitored for the following issues for this low-risk pregnancy and has Supervision of normal pregnancy, antepartum; History of gestational hypertension; and Unwanted fertility on her problem list.  Patient reports fatigue.  Contractions: Irritability. Vag. Bleeding: None.  Movement: Present. Denies leaking of fluid.   The following portions of the patient's history were reviewed and updated as appropriate: allergies, current medications, past family history, past medical history, past social history, past surgical history and problem list. Problem list updated.  Objective:   Vitals:   11/05/16 1045  BP: 108/71  Pulse: 88  Weight: 153 lb 12.8 oz (69.8 kg)    Fetal Status: Fetal Heart Rate (bpm): 135 Fundal Height: 30 cm Movement: Present     General:  Alert, oriented and cooperative. Patient is in no acute distress.  Skin: Skin is warm and dry. No rash noted.   Cardiovascular: Normal heart rate noted  Respiratory: Normal respiratory effort, no problems with respiration noted  Abdomen: Soft, gravid, appropriate for gestational age. Pain/Pressure: Present     Pelvic:  Cervical exam deferred        Extremities: Normal range of motion.  Edema: Trace  Mental Status: Normal mood and affect. Normal behavior. Normal judgment and thought content.   Assessment and Plan:  Pregnancy: [id number] at 7179w4d  1. Supervision of other normal pregnancy, antepartum Normal BP  Preterm labor symptoms and general obstetric precautions including but not limited to vaginal bleeding, contractions, leaking of fluid and fetal movement were reviewed in detail with the patient. Please refer to After Visit Summary for other counseling recommendations.  Return in about 2 weeks (around 11/19/2016).   Scheryl DarterJames Aloysious Vangieson, MD

## 2016-11-20 ENCOUNTER — Encounter: Payer: Self-pay | Admitting: Certified Nurse Midwife

## 2016-11-20 ENCOUNTER — Ambulatory Visit (INDEPENDENT_AMBULATORY_CARE_PROVIDER_SITE_OTHER): Payer: Medicaid Other | Admitting: Certified Nurse Midwife

## 2016-11-20 ENCOUNTER — Other Ambulatory Visit (HOSPITAL_COMMUNITY)
Admission: RE | Admit: 2016-11-20 | Discharge: 2016-11-20 | Disposition: A | Discharge status: Home / Self Care | Payer: Medicaid Other | Source: Ambulatory Visit | Attending: Certified Nurse Midwife | Admitting: Certified Nurse Midwife

## 2016-11-20 VITALS — BP 135/80 | HR 93 | Wt 154.8 lb

## 2016-11-20 DIAGNOSIS — O23593 Infection of other part of genital tract in pregnancy, third trimester: Secondary | ICD-10-CM | POA: Insufficient documentation

## 2016-11-20 DIAGNOSIS — N760 Acute vaginitis: Secondary | ICD-10-CM | POA: Diagnosis not present

## 2016-11-20 DIAGNOSIS — B373 Candidiasis of vulva and vagina: Secondary | ICD-10-CM | POA: Insufficient documentation

## 2016-11-20 DIAGNOSIS — Z3493 Encounter for supervision of normal pregnancy, unspecified, third trimester: Secondary | ICD-10-CM | POA: Diagnosis present

## 2016-11-20 DIAGNOSIS — N898 Other specified noninflammatory disorders of vagina: Secondary | ICD-10-CM

## 2016-11-20 DIAGNOSIS — O139 Gestational [pregnancy-induced] hypertension without significant proteinuria, unspecified trimester: Secondary | ICD-10-CM | POA: Diagnosis not present

## 2016-11-20 DIAGNOSIS — Z3480 Encounter for supervision of other normal pregnancy, unspecified trimester: Secondary | ICD-10-CM

## 2016-11-20 DIAGNOSIS — Z8759 Personal history of other complications of pregnancy, childbirth and the puerperium: Secondary | ICD-10-CM

## 2016-11-20 DIAGNOSIS — Z3A31 31 weeks gestation of pregnancy: Secondary | ICD-10-CM | POA: Insufficient documentation

## 2016-11-20 DIAGNOSIS — B3731 Acute candidiasis of vulva and vagina: Secondary | ICD-10-CM

## 2016-11-20 DIAGNOSIS — Z3483 Encounter for supervision of other normal pregnancy, third trimester: Secondary | ICD-10-CM

## 2016-11-20 MED ORDER — TERCONAZOLE 0.8 % VA CREA: 1.0000 | TOPICAL_CREAM | Freq: Every day | VAGINAL | 0 refills | Status: DC

## 2016-11-20 MED ORDER — FLUCONAZOLE 150 MG PO TABS: 150.0000 mg | ORAL_TABLET | Freq: Once | ORAL | 0 refills | Status: AC

## 2016-11-20 NOTE — Progress Notes (Signed)
   PRENATAL VISIT NOTE  Subjective:  Helen Tyler is a 31 y.o. [id number] at 7086w5d being seen today for ongoing prenatal care.  She is currently monitored for the following issues for this low-risk pregnancy and has Supervision of normal pregnancy, antepartum; History of gestational hypertension; and Unwanted fertility on her problem list.  Patient reports no bleeding, no contractions, no cramping, no leaking and vaginal irritation.  Contractions: Irregular. Vag. Bleeding: None.  Movement: Present. Denies leaking of fluid.   The following portions of the patient's history were reviewed and updated as appropriate: allergies, current medications, past family history, past medical history, past social history, past surgical history and problem list. Problem list updated.  Objective:   Vitals:   11/20/16 1016  BP: 135/80  Pulse: 93  Weight: 154 lb 12.8 oz (70.2 kg)    Fetal Status: Fetal Heart Rate (bpm): 155 Fundal Height: 31 cm Movement: Present     General:  Alert, oriented and cooperative. Patient is in no acute distress.  Skin: Skin is warm and dry. No rash noted.   Cardiovascular: Normal heart rate noted  Respiratory: Normal respiratory effort, no problems with respiration noted  Abdomen: Soft, gravid, appropriate for gestational age. Pain/Pressure: Present     Pelvic:  Cervical exam deferred        Extremities: Normal range of motion.  Edema: Trace  Mental Status: Normal mood and affect. Normal behavior. Normal judgment and thought content.   Assessment and Plan:  Pregnancy: [id number] at 7086w5d  1. History of gestational hypertension     Taking OTC baby ASA  2. Supervision of other normal pregnancy, antepartum     Doing well  3. Yeast vaginitis      - fluconazole (DIFLUCAN) 150 MG tablet; Take 1 tablet (150 mg total) by mouth once.  Dispense: 1 tablet; Refill: 0 - terconazole (TERAZOL 3) 0.8 % vaginal cream; Place 1 applicator vaginally at bedtime.  Dispense: 20 g;  Refill: 0  Preterm labor symptoms and general obstetric precautions including but not limited to vaginal bleeding, contractions, leaking of fluid and fetal movement were reviewed in detail with the patient. Please refer to After Visit Summary for other counseling recommendations.  Return in about 2 weeks (around 12/04/2016) for ROB.   Roe Coombsachelle A Denney, CNM

## 2016-11-20 NOTE — Progress Notes (Signed)
ROB c/o recurrent yeast infections.

## 2016-11-20 NOTE — Addendum Note (Signed)
Addended by: Natale MilchSTALLING, Charlotta Lapaglia D on: 11/20/2016 11:21 AM   Modules accepted: Orders

## 2016-11-21 LAB — CERVICOVAGINAL ANCILLARY ONLY
BACTERIAL VAGINITIS: NEGATIVE
CANDIDA VAGINITIS: POSITIVE — AB
Chlamydia: NEGATIVE
Neisseria Gonorrhea: NEGATIVE
TRICH (WINDOWPATH): NEGATIVE

## 2016-11-22 ENCOUNTER — Other Ambulatory Visit: Payer: Self-pay | Admitting: Certified Nurse Midwife

## 2016-12-04 ENCOUNTER — Encounter: Payer: Medicaid Other | Admitting: Obstetrics and Gynecology

## 2016-12-23 ENCOUNTER — Ambulatory Visit (INDEPENDENT_AMBULATORY_CARE_PROVIDER_SITE_OTHER): Payer: Medicaid Other | Admitting: Obstetrics and Gynecology

## 2016-12-23 ENCOUNTER — Encounter: Payer: Self-pay | Admitting: *Deleted

## 2016-12-23 ENCOUNTER — Other Ambulatory Visit (HOSPITAL_COMMUNITY)
Admission: RE | Admit: 2016-12-23 | Discharge: 2016-12-23 | Disposition: A | Discharge status: Home / Self Care | Payer: Medicaid Other | Source: Ambulatory Visit | Attending: Obstetrics and Gynecology | Admitting: Obstetrics and Gynecology

## 2016-12-23 VITALS — BP 131/79 | HR 88 | Wt 159.1 lb

## 2016-12-23 DIAGNOSIS — Z3480 Encounter for supervision of other normal pregnancy, unspecified trimester: Secondary | ICD-10-CM | POA: Diagnosis present

## 2016-12-23 DIAGNOSIS — Z3483 Encounter for supervision of other normal pregnancy, third trimester: Secondary | ICD-10-CM

## 2016-12-23 DIAGNOSIS — Z3009 Encounter for other general counseling and advice on contraception: Secondary | ICD-10-CM

## 2016-12-23 DIAGNOSIS — Z8759 Personal history of other complications of pregnancy, childbirth and the puerperium: Secondary | ICD-10-CM

## 2016-12-23 NOTE — Progress Notes (Signed)
Subjective:  Helen Tyler is a 31 y.o. [phone] at 5851w3d being seen today for ongoing prenatal care.  She is currently monitored for the following issues for this high-risk pregnancy and has Supervision of normal pregnancy, antepartum; History of gestational hypertension; and Unwanted fertility on her problem list.  Patient reports no complaints.  Contractions: Irregular. Vag. Bleeding: None.  Movement: Present. Denies leaking of fluid.   The following portions of the patient's history were reviewed and updated as appropriate: allergies, current medications, past family history, past medical history, past social history, past surgical history and problem list. Problem list updated.  Objective:   Vitals:   12/23/16 1108  BP: 131/79  Pulse: 88  Weight: 159 lb 1.6 oz (72.2 kg)    Fetal Status: Fetal Heart Rate (bpm): 138   Movement: Present     General:  Alert, oriented and cooperative. Patient is in no acute distress.  Skin: Skin is warm and dry. No rash noted.   Cardiovascular: Normal heart rate noted  Respiratory: Normal respiratory effort, no problems with respiration noted  Abdomen: Soft, gravid, appropriate for gestational age. Pain/Pressure: Present     Pelvic:  Cervical exam deferred        Extremities: Normal range of motion.  Edema: Trace  Mental Status: Normal mood and affect. Normal behavior. Normal judgment and thought content.   Urinalysis:      Assessment and Plan:  Pregnancy: [id number] at 2051w3d  1. Supervision of other normal pregnancy, antepartum Stable Labor precautions - Strep Gp B NAA - Cervicovaginal ancillary only  2. History of gestational hypertension BP stable Not taking BASA  3. Unwanted fertility Undecided. Can not locate papers, will have pt resign today  Term labor symptoms and general obstetric precautions including but not limited to vaginal bleeding, contractions, leaking of fluid and fetal movement were reviewed in detail with the  patient. Please refer to After Visit Summary for other counseling recommendations.  No Follow-up on file.   Hermina StaggersErvin, Chantele Corado L, MD

## 2016-12-24 LAB — CERVICOVAGINAL ANCILLARY ONLY
Bacterial vaginitis: NEGATIVE
CHLAMYDIA, DNA PROBE: NEGATIVE
Candida vaginitis: NEGATIVE
Neisseria Gonorrhea: NEGATIVE
Trichomonas: NEGATIVE

## 2016-12-25 LAB — STREP GP B NAA: Strep Gp B NAA: NEGATIVE

## 2017-01-01 ENCOUNTER — Ambulatory Visit (INDEPENDENT_AMBULATORY_CARE_PROVIDER_SITE_OTHER): Payer: Medicaid Other | Admitting: Obstetrics & Gynecology

## 2017-01-01 VITALS — BP 123/74 | HR 92 | Wt 154.0 lb

## 2017-01-01 DIAGNOSIS — Z3480 Encounter for supervision of other normal pregnancy, unspecified trimester: Secondary | ICD-10-CM

## 2017-01-01 DIAGNOSIS — Z3483 Encounter for supervision of other normal pregnancy, third trimester: Secondary | ICD-10-CM

## 2017-01-01 NOTE — Patient Instructions (Signed)
Vaginal Delivery Vaginal delivery means that you will give birth by pushing your baby out of your birth canal (vagina). A team of health care providers will help you before, during, and after vaginal delivery. Birth experiences are unique for every woman and every pregnancy, and birth experiences vary depending on where you choose to give birth. What should I do to prepare for my baby's birth? Before your baby is born, it is important to talk with your health care provider about:  Your labor and delivery preferences. These may include: ? Medicines that you may be given. ? How you will manage your pain. This might include non-medical pain relief techniques or injectable pain relief such as epidural analgesia. ? How you and your baby will be monitored during labor and delivery. ? Who may be in the labor and delivery room with you. ? Your feelings about surgical delivery of your baby (cesarean delivery, or C-section) if this becomes necessary. ? Your feelings about receiving donated blood through an IV tube (blood transfusion) if this becomes necessary.  Whether you are able: ? To take pictures or videos of the birth. ? To eat during labor and delivery. ? To move around, walk, or change positions during labor and delivery.  What to expect after your baby is born, such as: ? Whether delayed umbilical cord clamping and cutting is offered. ? Who will care for your baby right after birth. ? Medicines or tests that may be recommended for your baby. ? Whether breastfeeding is supported in your hospital or birth center. ? How long you will be in the hospital or birth center.  How any medical conditions you have may affect your baby or your labor and delivery experience.  To prepare for your baby's birth, you should also:  Attend all of your health care visits before delivery (prenatal visits) as recommended by your health care provider. This is important.  Prepare your home for your baby's  arrival. Make sure that you have: ? Diapers. ? Baby clothing. ? Feeding equipment. ? Safe sleeping arrangements for you and your baby.  Install a car seat in your vehicle. Have your car seat checked by a certified car seat installer to make sure that it is installed safely.  Think about who will help you with your new baby at home for at least the first several weeks after delivery.  What can I expect when I arrive at the birth center or hospital? Once you are in labor and have been admitted into the hospital or birth center, your health care provider may:  Review your pregnancy history and any concerns you have.  Insert an IV tube into one of your veins. This is used to give you fluids and medicines.  Check your blood pressure, pulse, temperature, and heart rate (vital signs).  Check whether your bag of water (amniotic sac) has broken (ruptured).  Talk with you about your birth plan and discuss pain control options.  Monitoring Your health care provider may monitor your contractions (uterine monitoring) and your baby's heart rate (fetal monitoring). You may need to be monitored:  Often, but not continuously (intermittently).  All the time or for long periods at a time (continuously). Continuous monitoring may be needed if: ? You are taking certain medicines, such as medicine to relieve pain or make your contractions stronger. ? You have pregnancy or labor complications.  Monitoring may be done by:  Placing a special stethoscope or a handheld monitoring device on your abdomen to   check your baby's heartbeat, and feeling your abdomen for contractions. This method of monitoring does not continuously record your baby's heartbeat or your contractions.  Placing monitors on your abdomen (external monitors) to record your baby's heartbeat and the frequency and length of contractions. You may not have to wear external monitors all the time.  Placing monitors inside of your uterus  (internal monitors) to record your baby's heartbeat and the frequency, length, and strength of your contractions. ? Your health care provider may use internal monitors if he or she needs more information about the strength of your contractions or your baby's heart rate. ? Internal monitors are put in place by passing a thin, flexible wire through your vagina and into your uterus. Depending on the type of monitor, it may remain in your uterus or on your baby's head until birth. ? Your health care provider will discuss the benefits and risks of internal monitoring with you and will ask for your permission before inserting the monitors.  Telemetry. This is a type of continuous monitoring that can be done with external or internal monitors. Instead of having to stay in bed, you are able to move around during telemetry. Ask your health care provider if telemetry is an option for you.  Physical exam Your health care provider may perform a physical exam. This may include:  Checking whether your baby is positioned: ? With the head toward your vagina (head-down). This is most common. ? With the head toward the top of your uterus (head-up or breech). If your baby is in a breech position, your health care provider may try to turn your baby to a head-down position so you can deliver vaginally. If it does not seem that your baby can be born vaginally, your provider may recommend surgery to deliver your baby. In rare cases, you may be able to deliver vaginally if your baby is head-up (breech delivery). ? Lying sideways (transverse). Babies that are lying sideways cannot be delivered vaginally.  Checking your cervix to determine: ? Whether it is thinning out (effacing). ? Whether it is opening up (dilating). ? How low your baby has moved into your birth canal.  What are the three stages of labor and delivery?  Normal labor and delivery is divided into the following three stages: Stage 1  Stage 1 is the  longest stage of labor, and it can last for hours or days. Stage 1 includes: ? Early labor. This is when contractions may be irregular, or regular and mild. Generally, early labor contractions are more than 10 minutes apart. ? Active labor. This is when contractions get longer, more regular, more frequent, and more intense. ? The transition phase. This is when contractions happen very close together, are very intense, and may last longer than during any other part of labor.  Contractions generally feel mild, infrequent, and irregular at first. They get stronger, more frequent (about every 2-3 minutes), and more regular as you progress from early labor through active labor and transition.  Many women progress through stage 1 naturally, but you may need help to continue making progress. If this happens, your health care provider may talk with you about: ? Rupturing your amniotic sac if it has not ruptured yet. ? Giving you medicine to help make your contractions stronger and more frequent.  Stage 1 ends when your cervix is completely dilated to 4 inches (10 cm) and completely effaced. This happens at the end of the transition phase. Stage 2  Once   your cervix is completely effaced and dilated to 4 inches (10 cm), you may start to feel an urge to push. It is common for the body to naturally take a rest before feeling the urge to push, especially if you received an epidural or certain other pain medicines. This rest period may last for up to 1-2 hours, depending on your unique labor experience.  During stage 2, contractions are generally less painful, because pushing helps relieve contraction pain. Instead of contraction pain, you may feel stretching and burning pain, especially when the widest part of your baby's head passes through the vaginal opening (crowning).  Your health care provider will closely monitor your pushing progress and your baby's progress through the vagina during stage 2.  Your  health care provider may massage the area of skin between your vaginal opening and anus (perineum) or apply warm compresses to your perineum. This helps it stretch as the baby's head starts to crown, which can help prevent perineal tearing. ? In some cases, an incision may be made in your perineum (episiotomy) to allow the baby to pass through the vaginal opening. An episiotomy helps to make the opening of the vagina larger to allow more room for the baby to fit through.  It is very important to breathe and focus so your health care provider can control the delivery of your baby's head. Your health care provider may have you decrease the intensity of your pushing, to help prevent perineal tearing.  After delivery of your baby's head, the shoulders and the rest of the body generally deliver very quickly and without difficulty.  Once your baby is delivered, the umbilical cord may be cut right away, or this may be delayed for 1-2 minutes, depending on your baby's health. This may vary among health care providers, hospitals, and birth centers.  If you and your baby are healthy enough, your baby may be placed on your chest or abdomen to help maintain the baby's temperature and to help you bond with each other. Some mothers and babies start breastfeeding at this time. Your health care team will dry your baby and help keep your baby warm during this time.  Your baby may need immediate care if he or she: ? Showed signs of distress during labor. ? Has a medical condition. ? Was born too early (prematurely). ? Had a bowel movement before birth (meconium). ? Shows signs of difficulty transitioning from being inside the uterus to being outside of the uterus. If you are planning to breastfeed, your health care team will help you begin a feeding. Stage 3  The third stage of labor starts immediately after the birth of your baby and ends after you deliver the placenta. The placenta is an organ that develops  during pregnancy to provide oxygen and nutrients to your baby in the womb.  Delivering the placenta may require some pushing, and you may have mild contractions. Breastfeeding can stimulate contractions to help you deliver the placenta.  After the placenta is delivered, your uterus should tighten (contract) and become firm. This helps to stop bleeding in your uterus. To help your uterus contract and to control bleeding, your health care provider may: ? Give you medicine by injection, through an IV tube, by mouth, or through your rectum (rectally). ? Massage your abdomen or perform a vaginal exam to remove any blood clots that are left in your uterus. ? Empty your bladder by placing a thin, flexible tube (catheter) into your bladder. ? Encourage   you to breastfeed your baby. After labor is over, you and your baby will be monitored closely to ensure that you are both healthy until you are ready to go home. Your health care team will teach you how to care for yourself and your baby. This information is not intended to replace advice given to you by your health care provider. Make sure you discuss any questions you have with your health care provider. Document Released: 03/12/2008 Document Revised: 12/22/2015 Document Reviewed: 06/18/2015 Elsevier Interactive Patient Education  2018 [employer]  

## 2017-01-01 NOTE — Progress Notes (Signed)
   PRENATAL VISIT NOTE  Subjective:  Helen Tyler is a 31 y.o. [id number] at 5963w5d being seen today for ongoing prenatal care.  She is currently monitored for the following issues for this low-risk pregnancy and has Supervision of normal pregnancy, antepartum; History of gestational hypertension; and Unwanted fertility on her problem list.  Patient reports no complaints.  Contractions: Irregular. Vag. Bleeding: None.  Movement: Present. Denies leaking of fluid.   The following portions of the patient's history were reviewed and updated as appropriate: allergies, current medications, past family history, past medical history, past social history, past surgical history and problem list. Problem list updated.  Objective:   Vitals:   01/01/17 1551  BP: 123/74  Pulse: 92  Weight: 154 lb (69.9 kg)    Fetal Status: Fetal Heart Rate (bpm): 140   Movement: Present     General:  Alert, oriented and cooperative. Patient is in no acute distress.  Skin: Skin is warm and dry. No rash noted.   Cardiovascular: Normal heart rate noted  Respiratory: Normal respiratory effort, no problems with respiration noted  Abdomen: Soft, gravid, appropriate for gestational age.  Pain/Pressure: Present     Pelvic: Cervical exam performed        Extremities: Normal range of motion.     Mental Status:  Normal mood and affect. Normal behavior. Normal judgment and thought content.   Assessment and Plan:  Pregnancy: [id number] at 4563w5d  1. Supervision of other normal pregnancy, antepartum Normal BP  Term labor symptoms and general obstetric precautions including but not limited to vaginal bleeding, contractions, leaking of fluid and fetal movement were reviewed in detail with the patient. Please refer to After Visit Summary for other counseling recommendations.  Return in about 1 week (around 01/08/2017).   Scheryl DarterJames Arnold, MD

## 2017-01-05 ENCOUNTER — Encounter (HOSPITAL_COMMUNITY): Payer: Self-pay | Admitting: *Deleted

## 2017-01-05 ENCOUNTER — Inpatient Hospital Stay (HOSPITAL_COMMUNITY)
Admission: AD | Admit: 2017-01-05 | Discharge: 2017-01-05 | Disposition: A | Discharge status: Home / Self Care | Payer: Medicaid Other | Source: Ambulatory Visit | Attending: Obstetrics & Gynecology | Admitting: Obstetrics & Gynecology

## 2017-01-05 DIAGNOSIS — O479 False labor, unspecified: Secondary | ICD-10-CM

## 2017-01-05 DIAGNOSIS — Z87891 Personal history of nicotine dependence: Secondary | ICD-10-CM | POA: Insufficient documentation

## 2017-01-05 DIAGNOSIS — O471 False labor at or after 37 completed weeks of gestation: Secondary | ICD-10-CM | POA: Insufficient documentation

## 2017-01-05 DIAGNOSIS — Z7982 Long term (current) use of aspirin: Secondary | ICD-10-CM | POA: Diagnosis not present

## 2017-01-05 DIAGNOSIS — Z8759 Personal history of other complications of pregnancy, childbirth and the puerperium: Secondary | ICD-10-CM

## 2017-01-05 DIAGNOSIS — Z3A38 38 weeks gestation of pregnancy: Secondary | ICD-10-CM | POA: Diagnosis not present

## 2017-01-05 DIAGNOSIS — O26893 Other specified pregnancy related conditions, third trimester: Secondary | ICD-10-CM | POA: Diagnosis present

## 2017-01-05 LAB — COMPREHENSIVE METABOLIC PANEL
ALK PHOS: 179 U/L — AB (ref 38–126)
ALT: 11 U/L — AB (ref 14–54)
ANION GAP: 7 (ref 5–15)
AST: 20 U/L (ref 15–41)
Albumin: 3.2 g/dL — ABNORMAL LOW (ref 3.5–5.0)
BILIRUBIN TOTAL: 0.6 mg/dL (ref 0.3–1.2)
BUN: 6 mg/dL (ref 6–20)
CALCIUM: 8.7 mg/dL — AB (ref 8.9–10.3)
CO2: 22 mmol/L (ref 22–32)
CREATININE: 0.54 mg/dL (ref 0.44–1.00)
Chloride: 106 mmol/L (ref 101–111)
GFR calc non Af Amer: >60 mL/min (ref >60)
Glucose, Bld: 89 mg/dL (ref 65–99)
Potassium: 3 mmol/L — ABNORMAL LOW (ref 3.5–5.1)
Sodium: 135 mmol/L (ref 135–145)
TOTAL PROTEIN: 7 g/dL (ref 6.5–8.1)

## 2017-01-05 LAB — CBC
HEMATOCRIT: 25.7 % — AB (ref 36.0–46.0)
HEMOGLOBIN: 8.2 g/dL — AB (ref 12.0–15.0)
MCH: 25.6 pg — AB (ref 26.0–34.0)
MCHC: 31.9 g/dL (ref 30.0–36.0)
MCV: 80.3 fL (ref 78.0–100.0)
Platelets: 290 10*3/uL (ref 150–400)
RBC: 3.2 MIL/uL — ABNORMAL LOW (ref 3.87–5.11)
RDW: 14.5 % (ref 11.5–15.5)
WBC: 8.6 10*3/uL (ref 4.0–10.5)

## 2017-01-05 LAB — URINALYSIS, ROUTINE W REFLEX MICROSCOPIC
BILIRUBIN URINE: NEGATIVE
GLUCOSE, UA: NEGATIVE mg/dL
HGB URINE DIPSTICK: NEGATIVE
KETONES UR: NEGATIVE mg/dL
Leukocytes, UA: NEGATIVE
NITRITE: NEGATIVE
PH: 7 (ref 5.0–8.0)
Protein, ur: NEGATIVE mg/dL
Specific Gravity, Urine: 1.019 (ref 1.005–1.030)

## 2017-01-05 LAB — PROTEIN / CREATININE RATIO, URINE
Creatinine, Urine: 176 mg/dL
Protein Creatinine Ratio: 0.13 mg/mg{Cre} (ref 0.00–0.15)
TOTAL PROTEIN, URINE: 23 mg/dL

## 2017-01-05 LAB — AMNISURE RUPTURE OF MEMBRANE (ROM) NOT AT ARMC: Amnisure ROM: NEGATIVE

## 2017-01-05 NOTE — MAU Provider Note (Signed)
History     CSN: [id number]  Arrival date and time: 01/05/17 1433   First Provider Initiated Contact with Patient 01/05/17 1608      Chief Complaint  Patient presents with  . Contractions   HPI Helen Tyler 31 y.o. [redacted]w[redacted]d  Came in today with loss of mucous plug and thinking she may have had ROM as she had fluid running down her leg earlier today.  Occasional contractions - is not in labor.    OB History    Gravida Para Term Preterm AB Living   4 2 2   1 2    SAB TAB Ectopic Multiple Live Births     1     2      Past Medical History:  Diagnosis Date  . Abnormal Pap smear 2002   Laser procedure done;Last pap 03/2012 was normal  . Anemia   . Chlamydia 2001   was treated  . Hypertension   . Infection    BV;not frequent;last dx'd 03/2012 was treated w/ Metrogel  . Infection    Yeast;not frequent  . No pertinent past medical history     Past Surgical History:  Procedure Laterality Date  . NO PAST SURGERIES      Family History  Problem Relation Age of Onset  . Hypertension Mother   . Hypertension Maternal Grandmother   . Cancer Maternal Grandmother        Stomach  . Congestive Heart Failure Maternal Grandmother   . Mental illness Father   . Other Neg Hx     Social History  Substance Use Topics  . Smoking status: Former Smoker    Quit date: 05/26/2016  . Smokeless tobacco: Never Used  . Alcohol use No    Allergies: No Known Allergies  Prescriptions Prior to Admission  Medication Sig Dispense Refill Last Dose  . ondansetron (ZOFRAN) 4 MG tablet Take 4 mg by mouth every 8 (eight) hours as needed for nausea or vomiting.   01/05/2017 at Unknown time  . promethazine (PHENERGAN) 12.5 MG tablet Take 12.5 mg by mouth every 6 (six) hours as needed for nausea or vomiting.   01/05/2017 at Unknown time  . aspirin EC 81 MG tablet Take 1 tablet (81 mg total) by mouth daily. Take after 12 weeks for prevention of preeclampsia later in pregnancy (Patient not taking:  Reported on 09/24/2016) 300 tablet 2 Not Taking  . prenatal vitamin w/FE, FA (PRENATAL 1 + 1) 27-1 MG TABS tablet Take 1 tablet by mouth daily at 12 noon. (Patient not taking: Reported on 12/23/2016) 30 each 0 Not Taking    Review of Systems  Constitutional: Negative for fever.  Gastrointestinal: Positive for abdominal pain.  Genitourinary: Negative for dysuria and vaginal bleeding.       Leaking of fluid and lost mucous plug   Physical Exam   Blood pressure 128/69, pulse 86, temperature 98 F (36.7 C), temperature source Oral, resp. rate 16, height 5\' 5"  (1.651 m), weight 156 lb 1.9 oz (70.8 kg), last menstrual period 04/12/2016, SpO2 100 %, unknown if currently breastfeeding.  Physical Exam  Nursing note and vitals reviewed. Constitutional: She is oriented to person, place, and time. She appears well-developed and well-nourished.  HENT:  Head: Normocephalic.  Eyes: EOM are normal.  Neck: Neck supple.  GI: Soft. There is no tenderness. There is no rebound and no guarding.  FHT on monitor is baseline of 135 with moderate variability and accels of 15x15 beats - reactive strip.  Occasional contraction but not in labor.  Genitourinary:  Genitourinary Comments: Sterile speculum exam - no leaking or pooling seen.  Amnisure done. Cervix 1 cm, thick and baby is -3.  No blood seen.  Musculoskeletal: Normal range of motion.  Neurological: She is alert and oriented to person, place, and time.  Skin: Skin is warm and dry.  Psychiatric: She has a normal mood and affect.    MAU Course  Procedures Results for orders placed or performed during the hospital encounter of 01/05/17 (from the past 24 hour(s))  Urinalysis, Routine w reflex microscopic     Status: None   Collection Time: 01/05/17  2:40 PM  Result Value Ref Range   Color, Urine YELLOW YELLOW   APPearance CLEAR CLEAR   Specific Gravity, Urine 1.019 1.005 - 1.030   pH 7.0 5.0 - 8.0   Glucose, UA NEGATIVE NEGATIVE mg/dL   Hgb urine  dipstick NEGATIVE NEGATIVE   Bilirubin Urine NEGATIVE NEGATIVE   Ketones, ur NEGATIVE NEGATIVE mg/dL   Protein, ur NEGATIVE NEGATIVE mg/dL   Nitrite NEGATIVE NEGATIVE   Leukocytes, UA NEGATIVE NEGATIVE  Protein / creatinine ratio, urine     Status: None   Collection Time: 01/05/17  2:40 PM  Result Value Ref Range   Creatinine, Urine 176.00 mg/dL   Total Protein, Urine 23 mg/dL   Protein Creatinine Ratio 0.13 0.00 - 0.15 mg/mg[Cre]  CBC     Status: Abnormal   Collection Time: 01/05/17  3:21 PM  Result Value Ref Range   WBC 8.6 4.0 - 10.5 K/uL   RBC 3.20 (L) 3.87 - 5.11 MIL/uL   Hemoglobin 8.2 (L) 12.0 - 15.0 g/dL   HCT 82.9 (L) 56.2 - 13.0 %   MCV 80.3 78.0 - 100.0 fL   MCH 25.6 (L) 26.0 - 34.0 pg   MCHC 31.9 30.0 - 36.0 g/dL   RDW 86.5 78.4 - 69.6 %   Platelets 290 150 - 400 K/uL  Comprehensive metabolic panel     Status: Abnormal   Collection Time: 01/05/17  3:21 PM  Result Value Ref Range   Sodium 135 135 - 145 mmol/L   Potassium 3.0 (L) 3.5 - 5.1 mmol/L   Chloride 106 101 - 111 mmol/L   CO2 22 22 - 32 mmol/L   Glucose, Bld 89 65 - 99 mg/dL   BUN 6 6 - 20 mg/dL   Creatinine, Ser 2.95 0.44 - 1.00 mg/dL   Calcium 8.7 (L) 8.9 - 10.3 mg/dL   Total Protein 7.0 6.5 - 8.1 g/dL   Albumin 3.2 (L) 3.5 - 5.0 g/dL   AST 20 15 - 41 U/L   ALT 11 (L) 14 - 54 U/L   Alkaline Phosphatase 179 (H) 38 - 126 U/L   Total Bilirubin 0.6 0.3 - 1.2 mg/dL   GFR calc non Af Amer >60 >60 mL/min   GFR calc Af Amer >60 >60 mL/min   Anion gap 7 5 - 15  Amnisure rupture of membrane (rom)not at [facility]     Status: None   Collection Time: 01/05/17  3:49 PM  Result Value Ref Range   Amnisure ROM NEGATIVE     MDM One BP on arrival was elevated so labs done to assess for preeclampsia - normal labs and serial BPs were normal.  Client is not laboring and does not have ROM so will send home.  Fetal monitor strip is reactive.  Assessment and Plan  False labor  Plan No evidence of ROM Not  in labor.   Cervix is 1 cm and thick. Keep your appointment in the office this week. Return if you have vaginal bleeding, leaking of fluid or worsening contractions.  Takiya Belmares L Kathaleya Mcduffee 01/05/2017, 4:09 PM

## 2017-01-05 NOTE — Discharge Instructions (Signed)
Keep your appointment in the office this week. Return if you have vaginal bleeding, leaking of fluid or worsening contractions.

## 2017-01-05 NOTE — MAU Note (Signed)
Patient presents with having lost her mucus plug and having contractions every 5 to 7 minutes today. Was 1 cm dilated this past week

## 2017-01-08 ENCOUNTER — Inpatient Hospital Stay (HOSPITAL_COMMUNITY): Payer: Medicaid Other | Admitting: Anesthesiology

## 2017-01-08 ENCOUNTER — Inpatient Hospital Stay (HOSPITAL_COMMUNITY)
Admission: AD | Admit: 2017-01-08 | Discharge: 2017-01-11 | DRG: 765 | Disposition: A | Discharge status: Home / Self Care | Payer: Medicaid Other | Source: Ambulatory Visit | Attending: Obstetrics & Gynecology | Admitting: Obstetrics & Gynecology

## 2017-01-08 ENCOUNTER — Encounter (HOSPITAL_COMMUNITY): Payer: Self-pay

## 2017-01-08 ENCOUNTER — Inpatient Hospital Stay (HOSPITAL_COMMUNITY)
Admission: AD | Admit: 2017-01-08 | Discharge: 2017-01-08 | Disposition: A | Discharge status: Home / Self Care | Payer: Medicaid Other | Source: Ambulatory Visit | Attending: Obstetrics & Gynecology | Admitting: Obstetrics & Gynecology

## 2017-01-08 ENCOUNTER — Encounter (HOSPITAL_COMMUNITY)
Admission: AD | Disposition: A | Discharge status: Home / Self Care | Payer: Self-pay | Source: Ambulatory Visit | Attending: Obstetrics & Gynecology

## 2017-01-08 DIAGNOSIS — Z3009 Encounter for other general counseling and advice on contraception: Secondary | ICD-10-CM

## 2017-01-08 DIAGNOSIS — Z87891 Personal history of nicotine dependence: Secondary | ICD-10-CM | POA: Diagnosis not present

## 2017-01-08 DIAGNOSIS — Z3A38 38 weeks gestation of pregnancy: Secondary | ICD-10-CM

## 2017-01-08 DIAGNOSIS — Z8759 Personal history of other complications of pregnancy, childbirth and the puerperium: Secondary | ICD-10-CM

## 2017-01-08 DIAGNOSIS — O4593 Premature separation of placenta, unspecified, third trimester: Secondary | ICD-10-CM | POA: Diagnosis not present

## 2017-01-08 DIAGNOSIS — O9081 Anemia of the puerperium: Secondary | ICD-10-CM | POA: Diagnosis not present

## 2017-01-08 DIAGNOSIS — Z98891 History of uterine scar from previous surgery: Secondary | ICD-10-CM

## 2017-01-08 DIAGNOSIS — O471 False labor at or after 37 completed weeks of gestation: Secondary | ICD-10-CM

## 2017-01-08 DIAGNOSIS — Z3480 Encounter for supervision of other normal pregnancy, unspecified trimester: Secondary | ICD-10-CM

## 2017-01-08 LAB — URINALYSIS, ROUTINE W REFLEX MICROSCOPIC
Bilirubin Urine: NEGATIVE
Glucose, UA: NEGATIVE mg/dL
Ketones, ur: NEGATIVE mg/dL
NITRITE: NEGATIVE
Protein, ur: NEGATIVE mg/dL
SPECIFIC GRAVITY, URINE: 1.015 (ref 1.005–1.030)
pH: 7 (ref 5.0–8.0)

## 2017-01-08 SURGERY — Surgical Case
Anesthesia: Regional

## 2017-01-08 MED ORDER — DEXAMETHASONE SODIUM PHOSPHATE 10 MG/ML IJ SOLN
INTRAMUSCULAR | Status: AC
  Filled 2017-01-08: qty 1

## 2017-01-08 MED ORDER — FENTANYL CITRATE (PF) 250 MCG/5ML IJ SOLN
INTRAMUSCULAR | Status: AC
  Filled 2017-01-08: qty 5

## 2017-01-08 MED ORDER — SODIUM CHLORIDE 0.9 % IR SOLN
Status: DC | PRN
  Administered 2017-01-08: 1

## 2017-01-08 MED ORDER — SCOPOLAMINE 1 MG/3DAYS TD PT72
MEDICATED_PATCH | TRANSDERMAL | Status: AC
Start: 2017-01-08 — End: ?
  Filled 2017-01-08: qty 1

## 2017-01-08 MED ORDER — SUCCINYLCHOLINE CHLORIDE 20 MG/ML IJ SOLN
INTRAMUSCULAR | Status: DC | PRN
  Administered 2017-01-08: 120 mg via INTRAVENOUS

## 2017-01-08 MED ORDER — PROMETHAZINE HCL 25 MG/ML IJ SOLN: 6.2500 mg | INTRAMUSCULAR | Status: DC | PRN

## 2017-01-08 MED ORDER — SCOPOLAMINE 1 MG/3DAYS TD PT72
MEDICATED_PATCH | TRANSDERMAL | Status: DC | PRN
  Administered 2017-01-08: 1 via TRANSDERMAL

## 2017-01-08 MED ORDER — HYDROMORPHONE HCL 1 MG/ML IJ SOLN
INTRAMUSCULAR | Status: AC
  Filled 2017-01-08: qty 1

## 2017-01-08 MED ORDER — FENTANYL CITRATE (PF) 100 MCG/2ML IJ SOLN
INTRAMUSCULAR | Status: DC | PRN
  Administered 2017-01-08: 250 ug via INTRAVENOUS
  Administered 2017-01-08: 100 ug via INTRAVENOUS

## 2017-01-08 MED ORDER — OXYTOCIN 10 UNIT/ML IJ SOLN
INTRAVENOUS | Status: DC | PRN
  Administered 2017-01-08: 40 [IU] via INTRAVENOUS

## 2017-01-08 MED ORDER — HYDROMORPHONE HCL 1 MG/ML IJ SOLN
INTRAMUSCULAR | Status: DC | PRN
  Administered 2017-01-08: 1 mg via INTRAVENOUS

## 2017-01-08 MED ORDER — ONDANSETRON HCL 4 MG/2ML IJ SOLN
INTRAMUSCULAR | Status: AC
  Filled 2017-01-08: qty 2

## 2017-01-08 MED ORDER — MIDAZOLAM HCL 2 MG/2ML IJ SOLN
INTRAMUSCULAR | Status: DC | PRN
  Administered 2017-01-08: 2 mg via INTRAVENOUS

## 2017-01-08 MED ORDER — LACTATED RINGERS IV SOLN
INTRAVENOUS | Status: DC | PRN
  Administered 2017-01-08 (×2): via INTRAVENOUS

## 2017-01-08 MED ORDER — OXYTOCIN 10 UNIT/ML IJ SOLN
INTRAMUSCULAR | Status: AC
  Filled 2017-01-08: qty 4

## 2017-01-08 MED ORDER — ONDANSETRON HCL 4 MG/2ML IJ SOLN
INTRAMUSCULAR | Status: DC | PRN
  Administered 2017-01-08: 4 mg via INTRAVENOUS

## 2017-01-08 MED ORDER — CEFAZOLIN SODIUM-DEXTROSE 2-3 GM-% IV SOLR
INTRAVENOUS | Status: DC | PRN
  Administered 2017-01-08: 2 g via INTRAVENOUS

## 2017-01-08 MED ORDER — DEXAMETHASONE SODIUM PHOSPHATE 10 MG/ML IJ SOLN
INTRAMUSCULAR | Status: DC | PRN
  Administered 2017-01-08: 10 mg via INTRAVENOUS

## 2017-01-08 MED ORDER — PROPOFOL 10 MG/ML IV BOLUS
INTRAVENOUS | Status: DC | PRN
  Administered 2017-01-08: 200 mg via INTRAVENOUS

## 2017-01-08 MED ORDER — SUCCINYLCHOLINE CHLORIDE 200 MG/10ML IV SOSY
PREFILLED_SYRINGE | INTRAVENOUS | Status: AC
  Filled 2017-01-08: qty 10

## 2017-01-08 MED ORDER — CEFAZOLIN SODIUM-DEXTROSE 2-4 GM/100ML-% IV SOLN
INTRAVENOUS | Status: AC
  Filled 2017-01-08: qty 100

## 2017-01-08 MED ORDER — SOD CITRATE-CITRIC ACID 500-334 MG/5ML PO SOLN
ORAL | Status: AC
  Administered 2017-01-08: 23:00:00
  Filled 2017-01-08: qty 15

## 2017-01-08 MED ORDER — PROPOFOL 10 MG/ML IV BOLUS
INTRAVENOUS | Status: AC
  Filled 2017-01-08: qty 20

## 2017-01-08 MED ORDER — FENTANYL CITRATE (PF) 100 MCG/2ML IJ SOLN
INTRAMUSCULAR | Status: AC
  Filled 2017-01-08: qty 2

## 2017-01-08 MED ORDER — HYDROMORPHONE HCL 1 MG/ML IJ SOLN
0.2500 mg | INTRAMUSCULAR | Status: DC | PRN
  Administered 2017-01-09 (×2): 0.25 mg via INTRAVENOUS

## 2017-01-08 MED ORDER — MIDAZOLAM HCL 2 MG/2ML IJ SOLN
INTRAMUSCULAR | Status: AC
  Filled 2017-01-08: qty 2

## 2017-01-08 MED ORDER — LACTATED RINGERS IV SOLN
INTRAVENOUS | Status: DC | PRN
  Administered 2017-01-08: 23:00:00 via INTRAVENOUS

## 2017-01-08 MED ORDER — ONDANSETRON 4 MG PO TBDP: 4.0000 mg | ORAL_TABLET | Freq: Four times a day (QID) | ORAL | 0 refills | Status: DC | PRN

## 2017-01-08 SURGICAL SUPPLY — 38 items
ADH SKN CLS APL DERMABOND .7 (GAUZE/BANDAGES/DRESSINGS) ×1
CHLORAPREP W/TINT 26ML (MISCELLANEOUS) ×3 IMPLANT
CLAMP CORD UMBIL (MISCELLANEOUS) IMPLANT
CLOTH BEACON ORANGE TIMEOUT ST (SAFETY) ×3 IMPLANT
DERMABOND ADVANCED (GAUZE/BANDAGES/DRESSINGS) ×2
DERMABOND ADVANCED .7 DNX12 (GAUZE/BANDAGES/DRESSINGS) ×1 IMPLANT
DRSG OPSITE POSTOP 4X10 (GAUZE/BANDAGES/DRESSINGS) ×3 IMPLANT
ELECT REM PT RETURN 9FT ADLT (ELECTROSURGICAL) ×3
ELECTRODE REM PT RTRN 9FT ADLT (ELECTROSURGICAL) ×1 IMPLANT
EXTRACTOR VACUUM BELL STYLE (SUCTIONS) IMPLANT
GLOVE BIOGEL PI IND STRL 7.0 (GLOVE) ×1 IMPLANT
GLOVE BIOGEL PI IND STRL 8 (GLOVE) ×1 IMPLANT
GLOVE BIOGEL PI INDICATOR 7.0 (GLOVE) ×2
GLOVE BIOGEL PI INDICATOR 8 (GLOVE) ×2
GLOVE ECLIPSE 8.0 STRL XLNG CF (GLOVE) ×3 IMPLANT
GOWN STRL REUS W/TWL LRG LVL3 (GOWN DISPOSABLE) ×6 IMPLANT
KIT ABG SYR 3ML LUER SLIP (SYRINGE) ×3 IMPLANT
NEEDLE HYPO 18GX1.5 BLUNT FILL (NEEDLE) ×3 IMPLANT
NEEDLE HYPO 22GX1.5 SAFETY (NEEDLE) ×3 IMPLANT
NEEDLE HYPO 25X5/8 SAFETYGLIDE (NEEDLE) ×3 IMPLANT
NS IRRIG 1000ML POUR BTL (IV SOLUTION) ×3 IMPLANT
PACK C SECTION WH (CUSTOM PROCEDURE TRAY) ×3 IMPLANT
PAD OB MATERNITY 4.3X12.25 (PERSONAL CARE ITEMS) ×3 IMPLANT
PENCIL SMOKE EVAC W/HOLSTER (ELECTROSURGICAL) ×3 IMPLANT
RTRCTR C-SECT PINK 25CM LRG (MISCELLANEOUS) IMPLANT
SUT CHROMIC 0 CT 1 (SUTURE) ×3 IMPLANT
SUT MNCRL 0 VIOLET CTX 36 (SUTURE) ×2 IMPLANT
SUT MONOCRYL 0 CTX 36 (SUTURE) ×4
SUT PLAIN 2 0 (SUTURE)
SUT PLAIN 2 0 XLH (SUTURE) IMPLANT
SUT PLAIN ABS 2-0 CT1 27XMFL (SUTURE) IMPLANT
SUT VIC AB 0 CTX 36 (SUTURE) ×6
SUT VIC AB 0 CTX36XBRD ANBCTRL (SUTURE) ×2 IMPLANT
SUT VIC AB 4-0 KS 27 (SUTURE) ×3 IMPLANT
SYR 20CC LL (SYRINGE) ×6 IMPLANT
TOWEL OR 17X24 6PK STRL BLUE (TOWEL DISPOSABLE) ×3 IMPLANT
TRAY FOLEY BAG SILVER LF 14FR (SET/KITS/TRAYS/PACK) IMPLANT
TRAY FOLEY CATH SILVER 16FR LF (SET/KITS/TRAYS/PACK) ×3 IMPLANT

## 2017-01-08 NOTE — MAU Note (Signed)
Pt presents to MAU with complaints of vaginal spotting this morning when she wipes. Intercourse 2 days ago. Reports lower abdominal cramping

## 2017-01-08 NOTE — MAU Provider Note (Signed)
History     CSN: [id number]  Arrival date and time: 01/08/17 1347   First Provider Initiated Contact with Patient 01/08/17 1447      Chief Complaint  Patient presents with  . Vaginal Bleeding   Helen Tyler is a 31 y.o. [id number] at 2262w5d presenting with 2 day history of scant mucousy pink spotting. Last intercourse 2 days ago. She's had intermittent upper and lower abdominal cramping over the last 2 days. Uses marijuana but no other illicit drugs. Good fetal movement. No leakage of fluid. No irritative vaginal discharge.     OB History  Gravida Para Term Preterm AB Living  4 2 2   1 2   SAB TAB Ectopic Multiple Live Births    1     2    # Outcome Date GA Lbr Len/2nd Weight Sex Delivery Anes PTL Lv  4 Current           3 Term 10/09/12 3863w6d 27:40 / 00:12 6 lb 10.7 oz (3.025 kg) M Vag-Spont EPI  LIV     Birth Comments: WNL   2 TAB 2009 7446w0d         1 Term 2008 7048w0d 06:00 7 lb 8 oz (3.402 kg) F Vag-Spont EPI  LIV     Birth Comments: No complications       Past Medical History:  Diagnosis Date  . Abnormal Pap smear 2002   Laser procedure done;Last pap 03/2012 was normal  . Anemia   . Chlamydia 2001   was treated  . Hypertension   . Infection    BV;not frequent;last dx'd 03/2012 was treated w/ Metrogel  . Infection    Yeast;not frequent  . No pertinent past medical history     Past Surgical History:  Procedure Laterality Date  . NO PAST SURGERIES      Family History  Problem Relation Age of Onset  . Hypertension Mother   . Hypertension Maternal Grandmother   . Cancer Maternal Grandmother        Stomach  . Congestive Heart Failure Maternal Grandmother   . Mental illness Father   . Other Neg Hx     Social History  Substance Use Topics  . Smoking status: Former Smoker    Quit date: 05/26/2016  . Smokeless tobacco: Never Used  . Alcohol use No    Allergies: No Known Allergies  Prescriptions Prior to Admission  Medication Sig Dispense Refill  Last Dose  . aspirin EC 81 MG tablet Take 1 tablet (81 mg total) by mouth daily. Take after 12 weeks for prevention of preeclampsia later in pregnancy (Patient not taking: Reported on 09/24/2016) 300 tablet 2 Not Taking  . prenatal vitamin w/FE, FA (PRENATAL 1 + 1) 27-1 MG TABS tablet Take 1 tablet by mouth daily at 12 noon. (Patient not taking: Reported on 12/23/2016) 30 each 0 Not Taking    Review of Systems  Constitutional: Positive for fatigue. Negative for fever.  Respiratory: Negative for shortness of breath.   Genitourinary: Positive for vaginal bleeding. Negative for dysuria, flank pain, hematuria, urgency, vaginal discharge and vaginal pain.  Musculoskeletal: Negative for back pain.  Psychiatric/Behavioral: The patient is not nervous/anxious.    Physical Exam   Blood pressure 128/84, pulse 94, temperature 98.2 F (36.8 C), resp. rate 18, last menstrual period 04/12/2016, unknown if currently breastfeeding.  Physical Exam  Nursing note and vitals reviewed. Constitutional: She is oriented to person, place, and time. She appears well-developed and well-nourished. No distress.  HENT:  Head: Normocephalic.  Eyes: No scleral icterus.  GI: Soft. There is no tenderness. There is no guarding.  term size  Genitourinary:  Genitourinary Comments: SVE: Scant pinkish mucus Cervix 1.5, long, ballotable Bedside US by me to confirm cephalic presentation  Musculoskeletal: Normal range of motion.  Neurological: She is alert and oriented to person, place, and time.  Skin: Skin is warm and dry.    MAU Course  Procedures Results for orders placed or performed during the hospital encounter of 01/08/17 (from the past 24 hour(s))  Urinalysis, Routine w reflex microscopic     Status: Abnormal   Collection Time: 01/08/17  2:28 PM  Result Value Ref Range   Color, Urine YELLOW YELLOW   APPearance CLEAR CLEAR   Specific Gravity, Urine 1.015 1.005 - 1.030   pH 7.0 5.0 - 8.0   Glucose, UA NEGATIVE  NEGATIVE mg/dL   Hgb urine dipstick SMALL (A) NEGATIVE   Bilirubin Urine NEGATIVE NEGATIVE   Ketones, ur NEGATIVE NEGATIVE mg/dL   Protein, ur NEGATIVE NEGATIVE mg/dL   Nitrite NEGATIVE NEGATIVE   Leukocytes, UA SMALL (A) NEGATIVE   RBC / HPF 0-5 0 - 5 RBC/hpf   WBC, UA 6-30 0 - 5 WBC/hpf   Bacteria, UA RARE (A) NONE SEEN   Squamous Epithelial / LPF 0-5 (A) NONE SEEN   Mucous PRESENT    Fetal monitoring Baseline 1:30, moderate variability, accelerations present, no decelerations  Toco: Slight irritability   MDM Spotting consistent with bloody show or postcoital etiology. Advised to return for worsening pain or bright red bleeding. Advised no further marijuana use and pelvic rest until next prenatal visit.   Assessment and Plan   G4P2012 at 5244w5d FWB by EFM 1. False labor after 37 completed weeks of gestation   2. History of gestational hypertension    Allergies as of 01/08/2017   No Known Allergies     Medication List    STOP taking these medications   aspirin EC 81 MG tablet     TAKE these medications   prenatal vitamin w/FE, FA 27-1 MG Tabs tablet Take 1 tablet by mouth daily at 12 noon.      Follow-up Information    [facility] Call.   Why:  Call office in am to inquire about rescheduling tomorrow's prenatal visit for 1 week  Contact information: [address] [place] [id number] [phone]          Earley Favoroe, Deirdre C, CNM 01/08/2017 2:49 PM

## 2017-01-08 NOTE — Transfer of Care (Signed)
Immediate Anesthesia Transfer of Care Note  Patient: Helen Tyler  Procedure(s) Performed: Procedure(s): CESAREAN SECTION (N/A)  Patient Location: PACU  Anesthesia Type:General  Level of Consciousness: awake and alert   Airway & Oxygen Therapy: Patient Spontanous Breathing and Patient connected to nasal cannula oxygen  Post-op Assessment: Report given to RN and Post -op Vital signs reviewed and stable  Post vital signs: Reviewed and stable  Last Vitals: HR 93 BP 146/94 Resp 16 O2 sat 100%  Last Pain:  Vitals:   01/08/17 2239  PainSc: 10-Worst pain ever         Complications: No apparent anesthesia complications

## 2017-01-08 NOTE — Discharge Instructions (Signed)

## 2017-01-08 NOTE — MAU Note (Signed)
CTX every 4 mins for past hour.  Last VE was 1.5 cm.  No complications with pregnancy.  Was seen here earlier for labor check.  Unsure of GBS

## 2017-01-08 NOTE — Anesthesia Procedure Notes (Signed)
Procedure Name: Intubation Date/Time: 01/08/2017 11:00 PM Performed by: Junious SilkGILBERT, Parks Czajkowski Pre-anesthesia Checklist: Patient identified, Emergency Drugs available, Suction available, Patient being monitored and Timeout performed Patient Re-evaluated:Patient Re-evaluated prior to induction Oxygen Delivery Method: Circle system utilized Preoxygenation: Pre-oxygenation with 100% oxygen Induction Type: IV induction, Rapid sequence and Cricoid Pressure applied Laryngoscope Size: Miller and 2 Grade View: Grade I Tube type: Oral Tube size: 7.0 mm Number of attempts: 1 Airway Equipment and Method: Stylet Placement Confirmation: ETT inserted through vocal cords under direct vision,  positive ETCO2,  CO2 detector and breath sounds checked- equal and bilateral Secured at: 23 cm Tube secured with: Tape Dental Injury: Teeth and Oropharynx as per pre-operative assessment

## 2017-01-08 NOTE — Anesthesia Preprocedure Evaluation (Signed)
Anesthesia Evaluation  Patient identified by MRN, date of birth, ID band Patient awake    Reviewed: Allergy & Precautions, NPO status , Patient's Chart, lab work & pertinent test results, Unable to perform ROS - Chart review onlyPreop documentation limited or incomplete due to emergent nature of procedure.  History of Anesthesia Complications Negative for: history of anesthetic complications  Airway Mallampati: II       Dental no notable dental hx. (+) Dental Advisory Given   Pulmonary neg pulmonary ROS, former smoker,    Pulmonary exam normal        Cardiovascular hypertension, negative cardio ROS Normal cardiovascular exam     Neuro/Psych negative neurological ROS  negative psych ROS   GI/Hepatic negative GI ROS, Neg liver ROS,   Endo/Other  negative endocrine ROS  Renal/GU negative Renal ROS  negative genitourinary   Musculoskeletal negative musculoskeletal ROS (+)   Abdominal   Peds negative pediatric ROS (+)  Hematology negative hematology ROS (+)   Anesthesia Other Findings   Reproductive/Obstetrics negative OB ROS                             Anesthesia Physical Anesthesia Plan  ASA: II and emergent  Anesthesia Plan: General   Post-op Pain Management:    Induction: Intravenous, Rapid sequence and Cricoid pressure planned  PONV Risk Score and Plan: 3 and Ondansetron and Dexamethasone  Airway Management Planned: Oral ETT  Additional Equipment:   Intra-op Plan:   Post-operative Plan: Extubation in OR  Informed Consent: I have reviewed the patients History and Physical, chart, labs and discussed the procedure including the risks, benefits and alternatives for the proposed anesthesia with the patient or authorized representative who has indicated his/her understanding and acceptance.     Plan Discussed with: CRNA, Anesthesiologist and Surgeon  Anesthesia Plan  Comments:         Anesthesia Quick Evaluation

## 2017-01-08 NOTE — Op Note (Signed)
Preoperative diagnosis:  1.  Intrauterine pregnancy at 1055w5d  weeks gestation                                         2.  Persistent Fetal Bradycardia                                         3.     Postoperative diagnosis:  Same as above plus placental abruption <25%  Procedure:  STAT Caesarean section  Surgeon:  Lazaro ArmsLuther H Eure MD  Assistant:    Anesthesia: General  Findings:  Presented to the maternity admission unit is complaining of regular uterine contractions.  She was placed on external fetal monitoring and the fetal heart rate was immediately in the 80s.  The heart rate would briefly go up into about 120 but just for a few seconds and it basically stayed in the 80s persistently.  It appear she was placed on the monitor at 2237.  I received a call at 2248 and tried to fetal scalp stimulation and moving the maternal abdomen.  The cervix was still 5 cm 100% effaced.  There was no appreciable bleeding at the time.  The fetal heart rate stayed down in the 80s and did not improve.  As a result I being unaware of how long the fetal heart rate made of actually been down prior to being placed in the monitor we proceeded with a emergency cesarean section and I called a code cesarean at 2254.  The patient arrived in the room at 2258 and the baby was delivered at 2302.   Over a low transverse incision was delivered a viable female with Apgars of 8 and 9 weighing pending lbs. pending oz. Uterus, tubes and ovaries were all normal.  There was a placental abruption obvious probably a little bit less than 25% and a loose nuchal cord 1 but this was not responsible for the fetal bradycardia.  There were no other significant findings  Description of operation:  Patient was taken to the operating room placed in the supine position of the table had a quick Betadine prep was draped and underwent general endotracheal anesthesia in an emergent fashion. A Pfannenstiel skin incision was made and carried down sharply to  the rectus fascia which was scored in the midline extended laterally. The fascia was taken off the muscles both superiorly and without difficulty. The muscles were divided.  The peritoneal cavity was entered.  Bladder blade was placed, no bladder flap was created.  A low transverse hysterotomy incision was made and delivered a viable female  infant at 2302 with Apgars of 8 and 9 weighingpending lbs  oz.  Cord pH was obtained and was 7.29. The uterus was exteriorized. It was closed in 2 layers, the first being a running interlocking layer and the second being an imbricating layer using 0 monocryl on a CTX needle. There was good resulting hemostasis. The uterus tubes and ovaries were all normal. Peritoneal cavity was irrigated vigorously. The muscles and peritoneum were reapproximated loosely. The fascia was closed using 0 Vicryl in running fashion. Subcutaneous tissue was made hemostatic and irrigated. The skin was closed using 4-0 Vicryl on a Keith needle in a subcuticular fashion.  Dermabond was placed for additional wound integrity and to serve as  a barrier. Blood loss for the procedure was 1000 cc. The patient received 2 gram of Ancef prophylactically. The patient was taken to the recovery room in good stable condition with all counts being correct x3.  Due to no preoperative shaving and suboptimal preoperative abdominal prep the patient will be kept on Zosyn for the next 24 hours.  EBL 1000 cc  EURE,LUTHER H 01/08/2017 11:50 PM

## 2017-01-08 NOTE — Consult Note (Signed)
Neonatology Note:   Attendance at C-section:    I was asked by Dr. Despina HiddenEure to attend this Stat primary C/S at 38 5/7 weeks due to fetal bradycardia. The mother is a G4P2A1 B pos, GBS neg seen in MAU and found to have FHR about 80. ROM 1 hour prior to delivery, fluid clear. C-section done under general anesthesia. Placental abruption noted at delivery. Delayed cord clamping was not done due to abruption. Infant vigorous with good spontaneous cry and tone. Needed only minimal bulb suctioning. Ap 8/9. Lungs clear to ausc in DR, perfusion good with pink color and good capillary refill. To CN to care of Pediatrician.   Doretha Souhristie C. Panhia Karl, MD

## 2017-01-09 ENCOUNTER — Encounter: Payer: Medicaid Other | Admitting: Obstetrics and Gynecology

## 2017-01-09 ENCOUNTER — Encounter (HOSPITAL_COMMUNITY): Payer: Self-pay | Admitting: *Deleted

## 2017-01-09 DIAGNOSIS — Z98891 History of uterine scar from previous surgery: Secondary | ICD-10-CM

## 2017-01-09 LAB — CBC
HCT: 18.3 % — ABNORMAL LOW (ref 36.0–46.0)
HEMOGLOBIN: 6.1 g/dL — AB (ref 12.0–15.0)
MCH: 26.2 pg (ref 26.0–34.0)
MCHC: 33.3 g/dL (ref 30.0–36.0)
MCV: 78.5 fL (ref 78.0–100.0)
Platelets: 244 10*3/uL (ref 150–400)
RBC: 2.33 MIL/uL — AB (ref 3.87–5.11)
RDW: 14.8 % (ref 11.5–15.5)
WBC: 27.6 10*3/uL — AB (ref 4.0–10.5)

## 2017-01-09 LAB — PREPARE RBC (CROSSMATCH)

## 2017-01-09 MED ORDER — SENNOSIDES-DOCUSATE SODIUM 8.6-50 MG PO TABS
2.0000 | ORAL_TABLET | ORAL | Status: DC
  Administered 2017-01-10 (×2): 2 via ORAL
  Filled 2017-01-09 (×2): qty 2

## 2017-01-09 MED ORDER — PRENATAL MULTIVITAMIN CH
1.0000 | ORAL_TABLET | Freq: Every day | ORAL | Status: DC
  Administered 2017-01-09 – 2017-01-10 (×2): 1 via ORAL
  Filled 2017-01-09 (×2): qty 1

## 2017-01-09 MED ORDER — MENTHOL 3 MG MT LOZG: 1.0000 | LOZENGE | OROMUCOSAL | Status: DC | PRN

## 2017-01-09 MED ORDER — ACETAMINOPHEN 325 MG PO TABS
650.0000 mg | ORAL_TABLET | Freq: Once | ORAL | Status: DC
  Filled 2017-01-09: qty 2

## 2017-01-09 MED ORDER — IBUPROFEN 600 MG PO TABS
600.0000 mg | ORAL_TABLET | Freq: Four times a day (QID) | ORAL | Status: DC
  Administered 2017-01-09: 600 mg via ORAL
  Filled 2017-01-09: qty 1

## 2017-01-09 MED ORDER — ZOLPIDEM TARTRATE 5 MG PO TABS: 5.0000 mg | ORAL_TABLET | Freq: Every evening | ORAL | Status: DC | PRN

## 2017-01-09 MED ORDER — FUROSEMIDE 10 MG/ML IJ SOLN
20.0000 mg | Freq: Once | INTRAMUSCULAR | Status: AC
  Administered 2017-01-09: 20 mg via INTRAVENOUS
  Filled 2017-01-09: qty 2

## 2017-01-09 MED ORDER — SIMETHICONE 80 MG PO CHEW
80.0000 mg | CHEWABLE_TABLET | Freq: Three times a day (TID) | ORAL | Status: DC
  Administered 2017-01-09 – 2017-01-10 (×4): 80 mg via ORAL
  Filled 2017-01-09 (×4): qty 1

## 2017-01-09 MED ORDER — OXYTOCIN 40 UNITS IN LACTATED RINGERS INFUSION - SIMPLE MED: 2.5000 [IU]/h | INTRAVENOUS | Status: AC

## 2017-01-09 MED ORDER — DIBUCAINE 1 % RE OINT: 1.0000 "application " | TOPICAL_OINTMENT | RECTAL | Status: DC | PRN

## 2017-01-09 MED ORDER — LACTATED RINGERS IV SOLN
INTRAVENOUS | Status: DC
  Administered 2017-01-09 (×2): via INTRAVENOUS

## 2017-01-09 MED ORDER — SODIUM CHLORIDE 0.9 % IV SOLN
Freq: Once | INTRAVENOUS | Status: AC
  Administered 2017-01-09: 13:00:00 via INTRAVENOUS

## 2017-01-09 MED ORDER — TETANUS-DIPHTH-ACELL PERTUSSIS 5-2.5-18.5 LF-MCG/0.5 IM SUSP: 0.5000 mL | Freq: Once | INTRAMUSCULAR | Status: DC

## 2017-01-09 MED ORDER — HYDROMORPHONE HCL 1 MG/ML IJ SOLN
1.0000 mg | INTRAMUSCULAR | Status: DC | PRN
  Administered 2017-01-09 (×2): 1 mg via INTRAVENOUS
  Filled 2017-01-09 (×2): qty 1

## 2017-01-09 MED ORDER — OXYCODONE HCL 5 MG PO TABS
10.0000 mg | ORAL_TABLET | ORAL | Status: DC | PRN
  Administered 2017-01-09: 5 mg via ORAL
  Administered 2017-01-10 – 2017-01-11 (×2): 10 mg via ORAL
  Filled 2017-01-09 (×3): qty 2

## 2017-01-09 MED ORDER — HYDROMORPHONE HCL 1 MG/ML IJ SOLN
INTRAMUSCULAR | Status: AC
  Administered 2017-01-09: 0.25 mg via INTRAVENOUS
  Filled 2017-01-09: qty 0.5

## 2017-01-09 MED ORDER — DIPHENHYDRAMINE HCL 25 MG PO CAPS: 25.0000 mg | ORAL_CAPSULE | Freq: Four times a day (QID) | ORAL | Status: DC | PRN

## 2017-01-09 MED ORDER — HYDROMORPHONE HCL 1 MG/ML IJ SOLN
INTRAMUSCULAR | Status: AC
  Filled 2017-01-09: qty 0.5

## 2017-01-09 MED ORDER — METHYLERGONOVINE MALEATE 0.2 MG/ML IJ SOLN: 0.2000 mg | INTRAMUSCULAR | Status: DC | PRN

## 2017-01-09 MED ORDER — SIMETHICONE 80 MG PO CHEW: 80.0000 mg | CHEWABLE_TABLET | ORAL | Status: DC | PRN

## 2017-01-09 MED ORDER — DIPHENHYDRAMINE HCL 25 MG PO CAPS
25.0000 mg | ORAL_CAPSULE | Freq: Once | ORAL | Status: AC
  Administered 2017-01-09: 25 mg via ORAL
  Filled 2017-01-09: qty 1

## 2017-01-09 MED ORDER — PNEUMOCOCCAL VAC POLYVALENT 25 MCG/0.5ML IJ INJ
0.5000 mL | INJECTION | INTRAMUSCULAR | Status: DC
  Filled 2017-01-09: qty 0.5

## 2017-01-09 MED ORDER — ACETAMINOPHEN 325 MG PO TABS
650.0000 mg | ORAL_TABLET | ORAL | Status: DC | PRN
  Administered 2017-01-09 – 2017-01-10 (×2): 650 mg via ORAL
  Filled 2017-01-09: qty 2

## 2017-01-09 MED ORDER — SIMETHICONE 80 MG PO CHEW
80.0000 mg | CHEWABLE_TABLET | ORAL | Status: DC
  Administered 2017-01-10 (×2): 80 mg via ORAL
  Filled 2017-01-09 (×2): qty 1

## 2017-01-09 MED ORDER — PIPERACILLIN-TAZOBACTAM 3.375 G IVPB: 3.3750 g | Freq: Three times a day (TID) | INTRAVENOUS | Status: DC

## 2017-01-09 MED ORDER — IBUPROFEN 100 MG/5ML PO SUSP
600.0000 mg | Freq: Four times a day (QID) | ORAL | Status: DC
  Administered 2017-01-09 – 2017-01-11 (×8): 600 mg via ORAL
  Filled 2017-01-09 (×9): qty 30

## 2017-01-09 MED ORDER — WITCH HAZEL-GLYCERIN EX PADS: 1.0000 "application " | MEDICATED_PAD | CUTANEOUS | Status: DC | PRN

## 2017-01-09 MED ORDER — COCONUT OIL OIL: 1.0000 "application " | TOPICAL_OIL | Status: DC | PRN

## 2017-01-09 MED ORDER — METHYLERGONOVINE MALEATE 0.2 MG PO TABS: 0.2000 mg | ORAL_TABLET | ORAL | Status: DC | PRN

## 2017-01-09 NOTE — Progress Notes (Signed)
I have seen and examined this patient and agree with the resident.

## 2017-01-09 NOTE — H&P (Signed)
Helen Tyler is a 31 y.o. female presenting for proceeding to the OR for stat C-section for persistent fetal bradycardia  G4P3013 Estimated Date of Delivery: 01/17/17 6717w5d . OB History    Gravida Para Term Preterm AB Living   4 3 3   1 3    SAB TAB Ectopic Multiple Live Births     1   0 3     Past Medical History:  Diagnosis Date  . Abnormal Pap smear 2002   Laser procedure done;Last pap 03/2012 was normal  . Anemia   . Chlamydia 2001   was treated  . Hypertension   . Infection    BV;not frequent;last dx'd 03/2012 was treated w/ Metrogel  . Infection    Yeast;not frequent  . No pertinent past medical history    Past Surgical History:  Procedure Laterality Date  . NO PAST SURGERIES     Family History: family history includes Cancer in her maternal grandmother; Congestive Heart Failure in her maternal grandmother; Hypertension in her maternal grandmother and mother; Mental illness in her father. Social History:  reports that she quit smoking about 7 months ago. She has never used smokeless tobacco. She reports that she uses drugs, including Marijuana. She reports that she does not drink alcohol.     Maternal Diabetes: No Genetic Screening: Declined Maternal Ultrasounds/Referrals: Normal Fetal Ultrasounds or other Referrals:  None Maternal Substance Abuse:  No Significant Maternal Medications:  None Significant Maternal Lab Results:  None Other Comments:  None  ROS History Dilation: 5 Effacement (%): 80 Station: 0 Exam by:: Ma Hillock Neill RN Last menstrual period 04/12/2016, unknown if currently breastfeeding. Exam Physical Exam  Prenatal labs: ABO, Rh: B/Positive/-- (01/31 1108) Antibody: Negative (01/31 1108) Rubella: 17.80 (01/31 1108) RPR: Non Reactive (05/08 1106)  HBsAg: Negative (01/31 1108)  HIV:    GBS: Negative (07/09 1133)   Assessment/Plan: 10317w5d  Persistent fetal bradycardia fetal heart rate in the 80s unresponsive to any measures Emergency  cesarean section   EURE,LUTHER H 01/09/2017, 12:05 AM

## 2017-01-09 NOTE — Progress Notes (Signed)
Subjective: Postpartum Day 1: STAT Cesarean Delivery 2/2 persistent fetal bradycardia + <25% abruption. Patient reports incisional pain, tolerating PO, + flatus and no problems voiding.    Objective: Vital signs in last 24 hours: Temp:  [97.6 F (36.4 C)-98.3 F (36.8 C)] 98.3 F (36.8 C) (07/26 0415) Pulse Rate:  [76-96] 86 (07/26 0415) Resp:  [12-18] 18 (07/26 0415) BP: (127-151)/(77-94) 135/79 (07/26 0415) SpO2:  [100 %] 100 % (07/26 0415)  Physical Exam:  General: alert, cooperative, appears stated age and no distress Lochia: appropriate Uterine Fundus: firm Incision: healing well, no significant drainage, no dehiscence, no significant erythema DVT Evaluation: No evidence of DVT seen on physical exam. Physical Exam  Constitutional: No distress.  Cardiovascular: Normal rate, regular rhythm and normal heart sounds.   Pulmonary/Chest: Effort normal and breath sounds normal. No respiratory distress. She has no wheezes. She has no rales.  Abdominal: Soft. Bowel sounds are normal. She exhibits no distension. There is tenderness. There is no rebound and no guarding.  Skin: Skin is warm and dry. She is not diaphoretic. No erythema.  Psychiatric: She has a normal mood and affect. Her behavior is normal.    Recent Labs  01/09/17 0546  HGB 6.1*  HCT 18.3*    Assessment/Plan: Status post Cesarean section. Postoperative course complicated by acute anemia to hgb 6.1 from 8.2 preop, asymptomatic at this time. Pressures wnl.  Will transfuse as necessary, informed consent was provided and placed in chart. Continue current care.  Conard NovakJoshua A Zaila Crew, MD 01/09/2017, 7:24 AM

## 2017-01-09 NOTE — Anesthesia Postprocedure Evaluation (Signed)
Anesthesia Post Note  Patient: Helen Tyler  Procedure(s) Performed: Procedure(s) (LRB): CESAREAN SECTION (N/A)     Patient location during evaluation: PACU Anesthesia Type: General Level of consciousness: sedated Pain management: pain level controlled Vital Signs Assessment: post-procedure vital signs reviewed and stable Respiratory status: spontaneous breathing and respiratory function stable Cardiovascular status: stable Anesthetic complications: no    Last Vitals:  Vitals:   01/09/17 0037 01/09/17 0045  BP:  (!) 150/91  Pulse: 78 76  Resp: 12 16  Temp:  36.6 C    Last Pain:  Vitals:   01/09/17 0045  PainSc: 4    Pain Goal:                 Helen Tyler

## 2017-01-09 NOTE — Progress Notes (Signed)
CRITICAL VALUE ALERT  Critical Value:  6.1  Date & Time Notied:  01/09/2017 0625   Provider Notified: Dr. Ephriam Knuckleshristian   Orders Received/Actions taken: He is coming to assess patient. No new orders. Royston CowperIsley, Eyla E, RN

## 2017-01-10 LAB — CBC
HCT: 21.2 % — ABNORMAL LOW (ref 36.0–46.0)
HCT: 22 % — ABNORMAL LOW (ref 36.0–46.0)
HEMOGLOBIN: 7.2 g/dL — AB (ref 12.0–15.0)
HEMOGLOBIN: 7.6 g/dL — AB (ref 12.0–15.0)
MCH: 26.8 pg (ref 26.0–34.0)
MCH: 27.3 pg (ref 26.0–34.0)
MCHC: 34 g/dL (ref 30.0–36.0)
MCHC: 34.5 g/dL (ref 30.0–36.0)
MCV: 78.8 fL (ref 78.0–100.0)
MCV: 79.1 fL (ref 78.0–100.0)
PLATELETS: 169 10*3/uL (ref 150–400)
Platelets: 165 10*3/uL (ref 150–400)
RBC: 2.69 MIL/uL — ABNORMAL LOW (ref 3.87–5.11)
RBC: 2.78 MIL/uL — AB (ref 3.87–5.11)
RDW: 16.8 % — AB (ref 11.5–15.5)
RDW: 16.8 % — ABNORMAL HIGH (ref 11.5–15.5)
WBC: 17.1 10*3/uL — AB (ref 4.0–10.5)
WBC: 18 10*3/uL — AB (ref 4.0–10.5)

## 2017-01-10 LAB — BPAM RBC
BLOOD PRODUCT EXPIRATION DATE: 201808022359
Blood Product Expiration Date: 201808012359
Blood Product Expiration Date: 201808022359
ISSUE DATE / TIME: 201807261633
ISSUE DATE / TIME: 201807261331
ISSUE DATE / TIME: 201807261854
UNIT TYPE AND RH: 9500
Unit Type and Rh: 9500
Unit Type and Rh: 9500

## 2017-01-10 LAB — TYPE AND SCREEN
ABO/RH(D): B POS
ANTIBODY SCREEN: NEGATIVE
UNIT DIVISION: 0
Unit division: 0
Unit division: 0

## 2017-01-10 NOTE — Clinical Social Work Maternal (Signed)
CLINICAL SOCIAL WORK MATERNAL/CHILD NOTE  Patient Details  Name: JODELLE FAUSTO MRN: [id number] Date of Birth: Jan 31, 1986  Date:  01/10/2017  Clinical Social Worker Initiating Note:  Terri Piedra, [place] Date/ Time Initiated:  01/10/17/1030     Child's Name:  Verlin Fester Mceachin   Legal Guardian:  Other (Comment) (Parents: Orson Gear and Levert Feinstein)   Need for Interpreter:  None   Date of Referral:  01/09/17     Reason for Referral:  Current Substance Use/Substance Use During Pregnancy    Referral Source:  [facility]   Address:  [address], [place], [place] [address]  Phone number:  [phone]   Household Members:  Significant Other, Minor Children (Kyndall (MOB's daughter age 32), Braylen (Couple's son age 32)   Natural Supports (not living in the home):  Immediate Family, Extended Family, Friends (Couple reports that they have a good support system.)   Professional Supports: Other (Comment) (MOB states that she has had Anger Management classes at [employer] [facility] the [place].)   Employment:     Type of Work: FOB works for [employer].  MOB is on Maternity Leave from [employer], but hopes to look for something else eventually.   Education:      Museum/gallery curator Resources:  [employer]   Other Resources:  [employer], Food Stamps    Cultural/Religious Considerations Which May Impact Care: None stated.  MOB's facesheet notes religion as [place].  Strengths:  Ability to meet basic needs , Pediatrician chosen , Home prepared for child , Other (Comment) (Interested in counseling)   Risk Factors/Current Problems:  Substance Use , Family/Relationship Issues    Cognitive State:  Alert , Linear Thinking , Able to Concentrate , Insightful , Goal Oriented    Mood/Affect:  Interested , Comfortable , Calm    CSW Assessment: CSW met with parents in MOB's first floor room/146 to offer support and complete assessment for marijuana use in pregnancy.  MOB had a  positive UDS on 07/17/16 at her initial visit.  Baby's UDS was positive for THC. Parents were pleasant and receptive of CSW's visit.  MOB gave consent to speak about anything with FOB present.  MOB reports that delivery went well and that she was aware that a c-section was a possibility.  She states baby is doing well.  She states she has two children at home, but they are being cared for by FOB's cousin at this time.  Parents state they have a good support system "for the most part."   MOB reports that she was aware that baby's UDS is positive for [facility] and was understanding that a report to CPS is mandatory.  CSW inquired about her use.  MOB states she quit smoking when she found out she was pregnant, but started smoking again within the last month due to weight loss and nausea.  She states no plans to continue smoking marijuana.  CSW informed her that she will be contacted by a CPS worker within the next 3 days and that CPS involvement will not delay their discharge from the hospital.  MOB denies hx of involvement and stated understanding. CSW provided education regarding PMADs and inquired about her emotional experience after her first two children were born.  She denies PMADs after her other children, but states this pregnancy was "more stressful" than her others.  She states she and FOB had some strain in their relationship, which has gotten better since she completed Anger Management classes at [facility] the [place].  MOB  states she is interested in continuing counseling and would like for her and FOB to have "marriage counseling."  She plans to contact [facility] about this.  CSW recommends Healthy Start services and MOB is interested.  CSW will make referral.  MOB understands that there is a waiting list.  MOB denies any current mental health concerns.  CSW provided her with information about support groups held at [facility] and encouraged her to self-evaluate with a New  Mom Checklist during the postpartum time period and contact her MD or counselor if concerns arise at any time.  MOB agreed.  CSW informed FOB that fathers can be affected by PMADs also.  He stated understanding and no current concerns. Parents are aware of SIDS concerns and precautions.  They report that they have a crib for baby at home.  MOB admits to smoking cigarettes and states she plans to smoke outside and understands importance of changing clothes/washing hands before handling the baby.  CSW Plan/Description:  Child Protective Service Report , Information/Referral to [employer] , No Further Intervention Required/No Barriers to Discharge, Patient/Family Education     Alphonzo Cruise, [place] 01/10/2017, 12:49 PM

## 2017-01-10 NOTE — Progress Notes (Addendum)
Rn noitified Dr. Rennis Pettyf blood pressure of 149/79. No further orders were given.

## 2017-01-11 MED ORDER — FERROUS FUMARATE 325 (106 FE) MG PO TABS: 1.0000 | ORAL_TABLET | Freq: Two times a day (BID) | ORAL | 0 refills | Status: DC

## 2017-01-11 MED ORDER — IBUPROFEN 100 MG/5ML PO SUSP: 600.0000 mg | Freq: Four times a day (QID) | ORAL | 3 refills | Status: DC | PRN

## 2017-01-11 MED ORDER — OXYCODONE HCL 10 MG PO TABS: 10.0000 mg | ORAL_TABLET | ORAL | 0 refills | Status: DC | PRN

## 2017-01-11 NOTE — Discharge Summary (Signed)
OB Discharge Summary     Patient Name: Helen Tyler DOB: 07-19-1985 MRN: [id number]  Date of admission: 01/08/2017 Delivering MD: Duane LopeEURE, LUTHER H   Date of discharge: 01/11/2017  Admitting diagnosis: 39 wks labor Intrauterine pregnancy: 1195w5d     Secondary diagnosis:  Active Problems:   S/P cesarean section  Additional problems: hx gHTN; anemia     Discharge diagnosis: Term Pregnancy Delivered and Anemia                                                                                                Post partum procedures:blood transfusionx 3u PRBCs  Augmentation: N/A  Complications: None  Hospital course:  Onset of Labor With Unplanned C/S  31 y.o. yo [id number] at 3295w5d was admitted in Latent Labor on 01/08/2017. Patient had a labor course significant for arrival to MAU and fetal bradycardia noted which was not responsive to any measures. Decision was made by Dr Despina HiddenEure for a stat pLTCS. Membrane Rupture Time/Date: 10:00 PM ,01/08/2017   The patient went for cesarean section due to Non-Reassuring FHR, and delivered a Viable infant,01/08/2017  Details of operation can be found in separate operative note. Patient had an uncomplicated postpartum course.  She is ambulating,tolerating a regular diet, passing flatus, and urinating well.  Patient is discharged home in stable condition 01/11/17.  Physical exam  Vitals:   01/10/17 0110 01/10/17 0553 01/10/17 1831 01/11/17 0544  BP:  (!) 149/79 130/84 137/74  Pulse:  65 84 83  Resp:  18 18 18   Temp: 98.4 F (36.9 C) 98.6 F (37 C) 98.3 F (36.8 C) 98.6 F (37 C)  TempSrc: Oral Oral Oral Oral  SpO2:       General: alert and cooperative Lochia: appropriate Uterine Fundus: firm Incision: honeycomb dry, marked and unchanged DVT Evaluation: No evidence of DVT seen on physical exam. Labs: Lab Results  Component Value Date   WBC 18.0 (H) 01/10/2017   HGB 7.6 (L) 01/10/2017   HCT 22.0 (L) 01/10/2017   MCV 79.1 01/10/2017   PLT 169  01/10/2017   CMP Latest Ref Rng & Units 01/05/2017  Glucose 65 - 99 mg/dL 89  BUN 6 - 20 mg/dL 6  Creatinine 6.960.44 - 2.951.00 mg/dL 2.840.54  Sodium 132135 - 440145 mmol/L 135  Potassium 3.5 - 5.1 mmol/L 3.0(L)  Chloride 101 - 111 mmol/L 106  CO2 22 - 32 mmol/L 22  Calcium 8.9 - 10.3 mg/dL 1.0(U8.7(L)  Total Protein 6.5 - 8.1 g/dL 7.0  Total Bilirubin 0.3 - 1.2 mg/dL 0.6  Alkaline Phos 38 - 126 U/L 179(H)  AST 15 - 41 U/L 20  ALT 14 - 54 U/L 11(L)    Discharge instruction: per After Visit Summary and "Baby and Me Booklet".  After visit meds:  Allergies as of 01/11/2017   No Known Allergies     Medication List    STOP taking these medications   ondansetron 4 MG disintegrating tablet Commonly known as:  ZOFRAN ODT     TAKE these medications   ferrous fumarate 325 (106 Fe) MG Tabs tablet Commonly known  as:  HEMOCYTE - 106 mg FE Take 1 tablet (106 mg of iron total) by mouth 2 (two) times daily.   ibuprofen 100 MG/5ML suspension Commonly known as:  ADVIL,MOTRIN Take 30 mLs (600 mg total) by mouth every 6 (six) hours as needed.   Oxycodone HCl 10 MG Tabs Take 1 tablet (10 mg total) by mouth every 4 (four) hours as needed (pain scale > 7).       Diet: routine diet  Activity: Advance as tolerated. Pelvic rest for 6 weeks.   Outpatient follow up: 4 weeks Follow up Appt:Future Appointments Date Time Provider Department Center  02/06/2017 9:00 AM Hermina StaggersErvin, Michael L, MD [facility] None   Follow up Visit:No Follow-up on file.  Postpartum contraception: Nexplanon (had signed BTL papers earlier this month, but is no longer interested)  Newborn Data: Live born female  Birth Weight: 6 lb 13 oz (3090 g) APGAR: 8, 9  Baby Feeding: Bottle and Breast Disposition:home with mother   01/11/2017 Cam HaiSHAW, Zahrah, CNM 9:22 AM

## 2017-01-11 NOTE — Discharge Instructions (Signed)

## 2017-02-06 ENCOUNTER — Ambulatory Visit (INDEPENDENT_AMBULATORY_CARE_PROVIDER_SITE_OTHER): Payer: Medicaid Other | Admitting: Obstetrics and Gynecology

## 2017-02-06 ENCOUNTER — Inpatient Hospital Stay (HOSPITAL_COMMUNITY)
Admission: AD | Admit: 2017-02-06 | Discharge: 2017-02-06 | Disposition: A | Discharge status: Home / Self Care | Payer: Medicaid Other | Source: Ambulatory Visit | Attending: Obstetrics & Gynecology | Admitting: Obstetrics & Gynecology

## 2017-02-06 DIAGNOSIS — O165 Unspecified maternal hypertension, complicating the puerperium: Secondary | ICD-10-CM | POA: Diagnosis not present

## 2017-02-06 DIAGNOSIS — I10 Essential (primary) hypertension: Secondary | ICD-10-CM | POA: Diagnosis present

## 2017-02-06 DIAGNOSIS — Z87891 Personal history of nicotine dependence: Secondary | ICD-10-CM | POA: Diagnosis not present

## 2017-02-06 LAB — BASIC METABOLIC PANEL
Anion gap: 8 (ref 5–15)
BUN: 10 mg/dL (ref 6–20)
CHLORIDE: 109 mmol/L (ref 101–111)
CO2: 22 mmol/L (ref 22–32)
Calcium: 9 mg/dL (ref 8.9–10.3)
Creatinine, Ser: 0.52 mg/dL (ref 0.44–1.00)
GFR calc Af Amer: >60 mL/min (ref >60)
GLUCOSE: 89 mg/dL (ref 65–99)
POTASSIUM: 3.8 mmol/L (ref 3.5–5.1)
SODIUM: 139 mmol/L (ref 135–145)

## 2017-02-06 LAB — COMPREHENSIVE METABOLIC PANEL
ALBUMIN: 3.7 g/dL (ref 3.5–5.0)
ALT: 9 U/L — ABNORMAL LOW (ref 14–54)
AST: 16 U/L (ref 15–41)
Alkaline Phosphatase: 65 U/L (ref 38–126)
Anion gap: 9 (ref 5–15)
BUN: 10 mg/dL (ref 6–20)
CHLORIDE: 110 mmol/L (ref 101–111)
CO2: 20 mmol/L — AB (ref 22–32)
CREATININE: 0.49 mg/dL (ref 0.44–1.00)
Calcium: 9.1 mg/dL (ref 8.9–10.3)
GFR calc Af Amer: >60 mL/min (ref >60)
GFR calc non Af Amer: >60 mL/min (ref >60)
Glucose, Bld: 88 mg/dL (ref 65–99)
Potassium: 3.8 mmol/L (ref 3.5–5.1)
SODIUM: 139 mmol/L (ref 135–145)
Total Bilirubin: 0.4 mg/dL (ref 0.3–1.2)
Total Protein: 7 g/dL (ref 6.5–8.1)

## 2017-02-06 LAB — URINALYSIS, ROUTINE W REFLEX MICROSCOPIC
Bilirubin Urine: NEGATIVE
GLUCOSE, UA: NEGATIVE mg/dL
KETONES UR: NEGATIVE mg/dL
NITRITE: NEGATIVE
PH: 5 (ref 5.0–8.0)
PROTEIN: NEGATIVE mg/dL
Specific Gravity, Urine: 1.025 (ref 1.005–1.030)

## 2017-02-06 LAB — CBC
HEMATOCRIT: 32.1 % — AB (ref 36.0–46.0)
HEMOGLOBIN: 10.2 g/dL — AB (ref 12.0–15.0)
MCH: 25.6 pg — ABNORMAL LOW (ref 26.0–34.0)
MCHC: 31.8 g/dL (ref 30.0–36.0)
MCV: 80.7 fL (ref 78.0–100.0)
PLATELETS: 368 10*3/uL (ref 150–400)
RBC: 3.98 MIL/uL (ref 3.87–5.11)
RDW: 16.7 % — AB (ref 11.5–15.5)
WBC: 6.6 10*3/uL (ref 4.0–10.5)

## 2017-02-06 LAB — POCT PREGNANCY, URINE: PREG TEST UR: NEGATIVE

## 2017-02-06 LAB — PROTEIN / CREATININE RATIO, URINE
Creatinine, Urine: 273 mg/dL
PROTEIN CREATININE RATIO: 0.05 mg/mg{creat} (ref 0.00–0.15)
Total Protein, Urine: 15 mg/dL

## 2017-02-06 MED ORDER — LABETALOL HCL 5 MG/ML IV SOLN: 80.0000 mg | Freq: Once | INTRAVENOUS | Status: DC

## 2017-02-06 MED ORDER — HYDRALAZINE HCL 20 MG/ML IJ SOLN
5.0000 mg | INTRAMUSCULAR | Status: AC | PRN
  Administered 2017-02-06: 10 mg via INTRAVENOUS
  Administered 2017-02-06: 5 mg via INTRAVENOUS
  Filled 2017-02-06: qty 1

## 2017-02-06 MED ORDER — NIFEDIPINE 10 MG PO CAPS
10.0000 mg | ORAL_CAPSULE | Freq: Once | ORAL | Status: AC
  Administered 2017-02-06: 10 mg via ORAL
  Filled 2017-02-06: qty 1

## 2017-02-06 MED ORDER — NIFEDIPINE ER OSMOTIC RELEASE 30 MG PO TB24
60.0000 mg | ORAL_TABLET | Freq: Once | ORAL | Status: AC
  Administered 2017-02-06: 60 mg via ORAL
  Filled 2017-02-06: qty 2

## 2017-02-06 MED ORDER — NIFEDIPINE ER OSMOTIC RELEASE 60 MG PO TB24: 60.0000 mg | ORAL_TABLET | Freq: Every day | ORAL | 2 refills | Status: AC

## 2017-02-06 MED ORDER — LABETALOL HCL 5 MG/ML IV SOLN
20.0000 mg | INTRAVENOUS | Status: AC | PRN
  Administered 2017-02-06: 20 mg via INTRAVENOUS
  Administered 2017-02-06: 40 mg via INTRAVENOUS
  Filled 2017-02-06: qty 4
  Filled 2017-02-06: qty 8

## 2017-02-06 NOTE — Discharge Instructions (Signed)
Postpartum Hypertension °Postpartum hypertension is high blood pressure after pregnancy that remains higher than normal for more than two days after delivery. You may not realize that you have postpartum hypertension if your blood pressure is not being checked regularly. In some cases, postpartum hypertension will go away on its own, usually within a week of delivery. However, for some women, medical treatment is required to prevent serious complications, such as seizures or stroke. °The following things can affect your blood pressure: °· The type of delivery you had. °· Having received IV fluids or other medicines during or after delivery. ° °What are the causes? °Postpartum hypertension may be caused by any of the following or by a combination of any of the following: °· Hypertension that existed before pregnancy (chronic hypertension). °· Gestational hypertension. °· Preeclampsia or eclampsia. °· Receiving a lot of fluid through an IV during or after delivery. °· Medicines. °· HELLP syndrome. °· Hyperthyroidism. °· Stroke. °· Other rare neurological or blood disorders. ° °In some cases, the cause may not be known. °What increases the risk? °Postpartum hypertension can be related to one or more risk factors, such as: °· Chronic hypertension. In some cases, this may not have been diagnosed before pregnancy. °· Obesity. °· Type 2 diabetes. °· Kidney disease. °· Family history of preeclampsia. °· Other medical conditions that cause hormonal imbalances. ° °What are the signs or symptoms? °As with all types of hypertension, postpartum hypertension may not have any symptoms. Depending on how high your blood pressure is, you may experience: °· Headaches. These may be mild, moderate, or severe. They may also be steady, constant, or sudden in onset (thunderclap headache). °· Visual changes. °· Dizziness. °· Shortness of breath. °· Swelling of your hands, feet, lower legs, or face. In some cases, you may have swelling in  more than one of these locations. °· Heart palpitations or a racing heartbeat. °· Difficulty breathing while lying down. °· Decreased urination. ° °Other rare signs and symptoms may include: °· Sweating more than usual. This lasts longer than a few days after delivery. °· Chest pain. °· Sudden dizziness when you get up from sitting or lying down. °· Seizures. °· Nausea or vomiting. °· Abdominal pain. ° °How is this diagnosed? °The diagnosis of postpartum hypertension is made through a combination of physical examination findings and testing of your blood and urine. You may also have additional tests, such as a CT scan or an MRI, to check for other complications of postpartum hypertension. °How is this treated? °When blood pressure is high enough to require treatment, your options may include: °· Medicines to reduce blood pressure (antihypertensives). Tell your health care provider if you are breastfeeding or if you plan to breastfeed. There are many antihypertensive medicines that are safe to take while breastfeeding. °· Stopping medicines that may be causing hypertension. °· Treating medical conditions that are causing hypertension. °· Treating the complications of hypertension, such as seizures, stroke, or kidney problems. ° °Your health care provider will also continue to monitor your blood pressure closely and repeatedly until it is within a safe range for you. °Follow these instructions at home: °· Take medicines only as directed by your health care provider. °· Get regular exercise after your health care provider tells you that it is safe. °· Follow your health care provider’s recommendations on fluid and salt restrictions. °· Do not use any tobacco products, including cigarettes, chewing tobacco, or electronic cigarettes. If you need help quitting, ask your health care provider. °·   Keep all follow-up visits as directed by your health care provider. This is important. °Contact a health care provider  if: °· Your symptoms get worse. °· You have new symptoms, such as: °? Headache. °? Dizziness. °? Visual changes. °Get help right away if: °· You develop a severe or sudden headache. °· You have seizures. °· You develop numbness or weakness on one side of your body. °· You have difficulty thinking, speaking, or swallowing. °· You develop severe abdominal pain. °· You develop difficulty breathing, chest pain, a racing heartbeat, or heart palpitations. °These symptoms may represent a serious problem that is an emergency. Do not wait to see if the symptoms will go away. Get medical help right away. Call your local emergency services (911 in the [place] Do not drive yourself to the hospital. °This information is not intended to replace advice given to you by your health care provider. Make sure you discuss any questions you have with your health care provider. °Document Released: 02/04/2014 Document Revised: 11/06/2015 Document Reviewed: 12/16/2013 °Elsevier Interactive Patient Education © 2018 [employer] ° °

## 2017-02-06 NOTE — Patient Instructions (Signed)

## 2017-02-06 NOTE — MAU Note (Signed)
Patient sent from clinic for further evaluation of elevated blood pressure (BP 179/117 and 164/119 in clinic per Dr. Alysia Penna) Patient c/o mild headache currently. Denies changes in vision, swelling or epigastric pain.   Hx GHTN with 1st pregnancy 10 years ago  PP C/S 7/25 for fetal bradycardia

## 2017-02-06 NOTE — Progress Notes (Signed)
Patient reports she is thinking about Depo for [facility]. Pt states that she has previously had headaches but none today, denies dizziness and blurred vision.  Marland Kitchen.Post Partum Exam  Helen Tyler is a 31 y.o. [phone] female who presents for a postpartum visit. She is 4 weeks postpartum following a low cervical transverse Cesarean section. I have fully reviewed the prenatal and intrapartum course. The delivery was at 38.5 gestational weeks.  Anesthesia: general. Postpartum course has been unremarkable. Baby's course has been unremarkable. Baby is feeding by bottle - Similac Advance. Bleeding staining only. Bowel function is normal. Bladder function is normal. Patient is not sexually active. Contraception method is none. Postpartum depression screening:neg  The following portions of the patient's history were reviewed and updated as appropriate: allergies, current medications, past family history, past medical history, past social history and past surgical history.  Review of Systems Pertinent items are noted in HPI.    Objective:  Blood pressure (!) 179/117, pulse 82, height 5\' 5"  (1.651 m), weight 133 lb 11.2 oz (60.6 kg), unknown if currently breastfeeding.  General:  alert   Breasts:  not examined  Lungs: clear to auscultation bilaterally  Heart:  regular rate and rhythm, S1, S2 normal, no murmur, click, rub or gallop  Abdomen: soft, non-tender; bowel sounds normal; no masses,  no organomegaly, incision healing well   Vulva:  not evaluated  Vagina: not evaluated  Cervix:  not examined  Corpus: not examined  Adnexa:  not evaluated  Rectal Exam: Not performed.        Assessment:    Nl postpartum exam.   Elevated BP  Plan:   1. Contraception: Depo-Provera injections 2. Pt instructed to go to MAU for further evaluation of BP. HA a few days ago, resolved with eating. No other S/Sx of PEC. 3. Follow up in: 1 year or as needed. Plus in 2 weeks for BP check and to start Depo Provera  injections

## 2017-02-06 NOTE — MAU Provider Note (Signed)
Patient Helen Tyler is a 31 y.o. [phone] At 5 weeks postpartum who was here for her postpartum check-up. She had an emergency c-section on 01-08-2017. At her clinic appointment she was found to have elevated pressures and was sent up here from MAU.  Postpartum course otherwise unremarkable.  History     CSN: [id number]  Arrival date and time: 02/06/17 1031   First Provider Initiated Contact with Patient 02/06/17 1058      Chief Complaint  Patient presents with  . Hypertension   Hypertension  This is a new problem. The current episode started today. The problem is unchanged. Pertinent negatives include no blurred vision or shortness of breath.  Patient denies HA and epigastric pain.   OB History    Gravida Para Term Preterm AB Living   4 3 3   1 3    SAB TAB Ectopic Multiple Live Births     1   0 3      Past Medical History:  Diagnosis Date  . Abnormal Pap smear 2002   Laser procedure done;Last pap 03/2012 was normal  . Anemia   . Chlamydia 2001   was treated  . Hypertension   . Infection    BV;not frequent;last dx'd 03/2012 was treated w/ Metrogel  . Infection    Yeast;not frequent  . No pertinent past medical history     Past Surgical History:  Procedure Laterality Date  . CESAREAN SECTION N/A 01/08/2017   Procedure: CESAREAN SECTION;  Surgeon: Lazaro Arms, MD;  Location: [facility] BIRTHING SUITES;  Service: Obstetrics;  Laterality: N/A;  . NO PAST SURGERIES      Family History  Problem Relation Age of Onset  . Hypertension Mother   . Hypertension Maternal Grandmother   . Cancer Maternal Grandmother        Stomach  . Congestive Heart Failure Maternal Grandmother   . Mental illness Father   . Other Neg Hx     Social History  Substance Use Topics  . Smoking status: Former Smoker    Quit date: 05/26/2016  . Smokeless tobacco: Never Used  . Alcohol use No    Allergies: No Known Allergies  Prescriptions Prior to Admission  Medication Sig Dispense  Refill Last Dose  . ferrous sulfate 325 (65 FE) MG tablet Take 325 mg by mouth daily with breakfast.   Past Week at Unknown time    Review of Systems  Eyes: Negative for blurred vision.  Respiratory: Negative.  Negative for shortness of breath.   Gastrointestinal: Negative.   Genitourinary: Negative.   Musculoskeletal: Negative.   Neurological: Negative.   Psychiatric/Behavioral: Negative.    Physical Exam   Blood pressure (!) 151/81, pulse 85, temperature 97.7 F (36.5 C), temperature source Oral, resp. rate 20, weight 134 lb (60.8 kg), SpO2 100 %, unknown if currently breastfeeding.  Physical Exam  Constitutional: She is oriented to person, place, and time. She appears well-developed and well-nourished.  HENT:  Head: Normocephalic and atraumatic.  Neck: Normal range of motion.  Respiratory: Effort normal.  GI: Soft.  Musculoskeletal: Normal range of motion.  Neurological: She is alert and oriented to person, place, and time. She has normal reflexes.  Skin: Skin is warm and dry.  Psychiatric: She has a normal mood and affect.    MAU Course  Procedures  MDM -CBC, CMP: negative -UPC: negative Over the course of her MAU visit, patient received IV hydration, plus IV started; 5, and 10 mg  of hydralazine given,  plus 20, 40, 80 mg of labetalol given IV. Upon discharge,  Blood pressures now 150s/100-90s.   Patient's blood pressure has been lowered after IV medications. 1 dose of Procardia 60 mg XL given.  Patient continues to be asymptomatic.  Assessment and Plan   1. Hypertension, postpartum condition or complication    2. Patient stable for discharge for RX for Procardia XL 60 mg.  3. Strict return precautions given; patient verbalized understanding.  4. Patient care discussed with Dr. Debroah Loop, who agrees with plan of care.  5. Patient to return to MAU for blood pressure check on Saturday am, 02-08-2017.   Charlesetta Garibaldi Cathan Gearin CNM 02/06/2017, 5:35 PM

## 2017-02-20 ENCOUNTER — Other Ambulatory Visit: Payer: Self-pay

## 2017-02-20 ENCOUNTER — Ambulatory Visit (INDEPENDENT_AMBULATORY_CARE_PROVIDER_SITE_OTHER): Payer: Medicaid Other | Admitting: Obstetrics and Gynecology

## 2017-02-20 VITALS — BP 171/118 | HR 83 | Wt 133.3 lb

## 2017-02-20 DIAGNOSIS — Z3042 Encounter for surveillance of injectable contraceptive: Secondary | ICD-10-CM

## 2017-02-20 DIAGNOSIS — Z0130 Encounter for examination of blood pressure without abnormal findings: Secondary | ICD-10-CM

## 2017-02-20 DIAGNOSIS — Z3202 Encounter for pregnancy test, result negative: Secondary | ICD-10-CM

## 2017-02-20 LAB — POCT URINE PREGNANCY: Preg Test, Ur: NEGATIVE

## 2017-02-20 MED ORDER — MEDROXYPROGESTERONE ACETATE 150 MG/ML IM SUSP
150.0000 mg | Freq: Once | INTRAMUSCULAR | Status: AC
  Administered 2017-02-20: 150 mg via INTRAMUSCULAR

## 2017-02-20 MED ORDER — MEDROXYPROGESTERONE ACETATE 150 MG/ML IM SUSP: 150.0000 mg | INTRAMUSCULAR | 0 refills | Status: AC

## 2017-02-20 MED ORDER — HYDROCHLOROTHIAZIDE 25 MG PO TABS: 25.0000 mg | ORAL_TABLET | Freq: Every day | ORAL | 3 refills | Status: AC

## 2017-02-20 NOTE — Progress Notes (Signed)
Administrations This Visit    medroxyPROGESTERone (DEPO-PROVERA) injection 150 mg    Admin Date 02/20/2017 Action Given Dose 150 mg Route Intramuscular Administered By Maretta BeesMcGlashan, Carol J, RMA        UPT is NEGATIVE. Next DEPO 11/22-12/62, 2018.  Subjective:  Helen Tyler is a 31 y.o. female with hypertension. Here for BP check. No S/Sx of SPEC. Reports taking BP meds. Bottle feeding  Current Outpatient Prescriptions  Medication Sig Dispense Refill  . ferrous sulfate 325 (65 FE) MG tablet Take 325 mg by mouth daily with breakfast.    . medroxyPROGESTERone (DEPO-PROVERA) 150 MG/ML injection Inject 1 mL (150 mg total) into the muscle every 3 (three) months. 1 mL 0  . NIFEdipine (PROCARDIA XL/ADALAT-CC) 60 MG 24 hr tablet Take 1 tablet (60 mg total) by mouth daily. 30 tablet 2   No current facility-administered medications for this visit.     Hypertension ROS: taking medications as instructed, no medication side effects noted, no TIA's, no chest pain on exertion, no dyspnea on exertion and no swelling of ankles.   New concerns: .   Objective:  BP (!) 171/118   Pulse 83   Wt 133 lb 4.8 oz (60.5 kg)   Breastfeeding? No   BMI 22.18 kg/m   Appearance alert, well appearing, and in no distress. General exam BP noted to be well controlled today in office.    Assessment:   Hypertension poorly controlled.   Plan:  Will add HCTZ to her Procardia. Has appt with PCP tomorrow for further eval. Depo Provera for contraception today.

## 2017-02-20 NOTE — Progress Notes (Unsigned)
Rx sent for depo

## 2017-10-19 IMAGING — US US MFM OB FOLLOW-UP
1 series · 14 of 28 positions shown · non-contrast
Comparison: none

[Series 1: us mfm ob follow-up · 60 acquisitions, 14 frames shown]
[im 3/60]
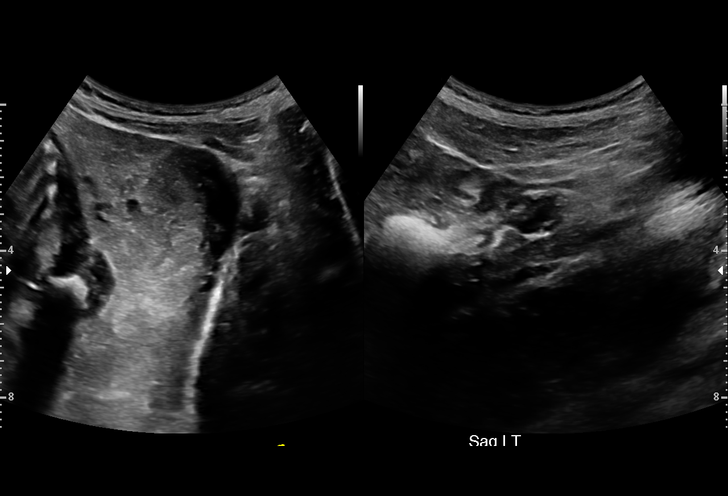
[im 7/60]
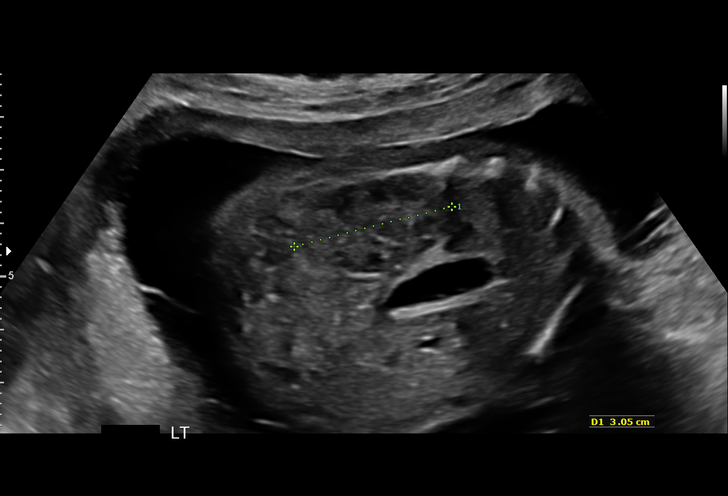
[im 11/60]
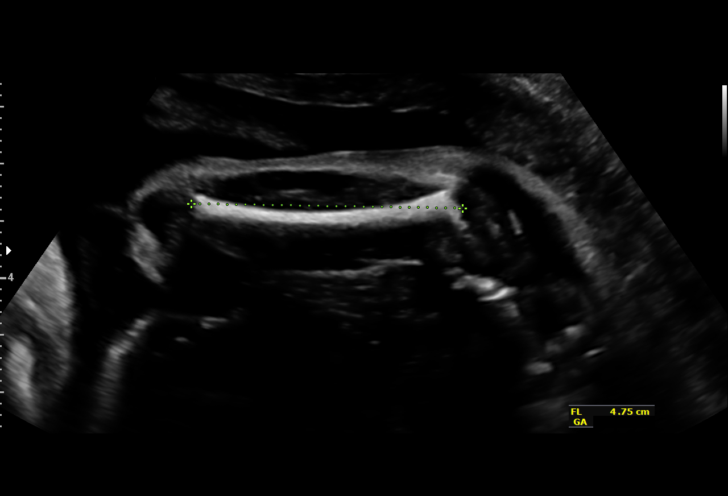
[im 16/60]
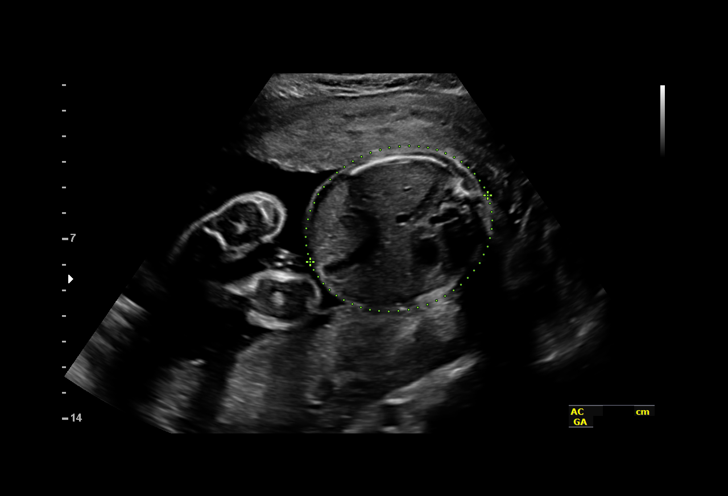
[im 20/60]
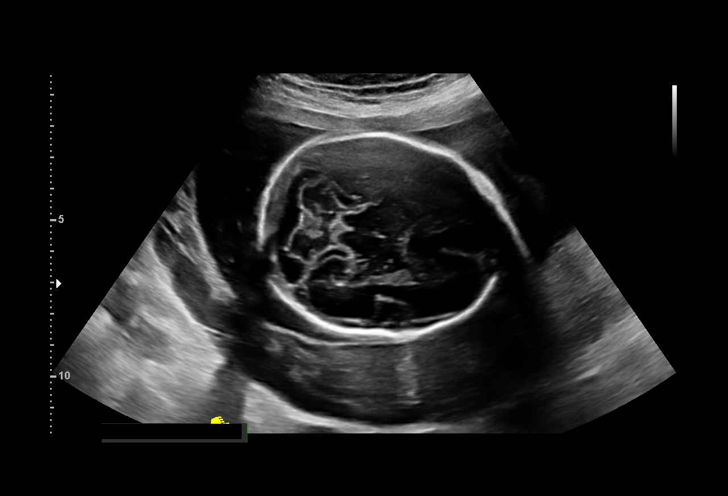
[im 25/60]
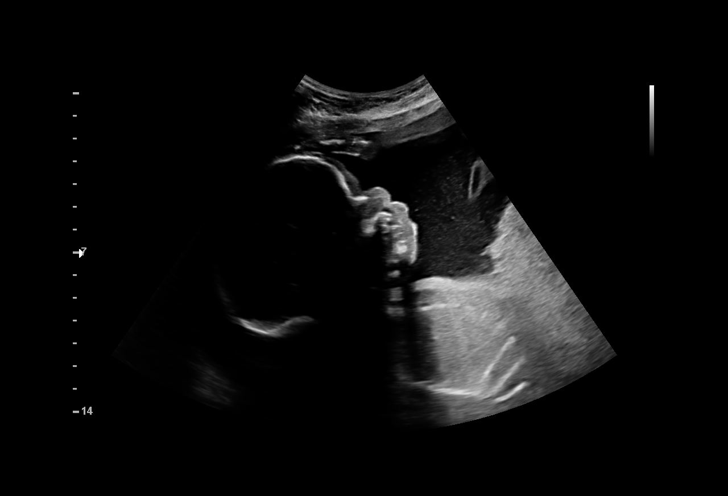
[im 29/60]
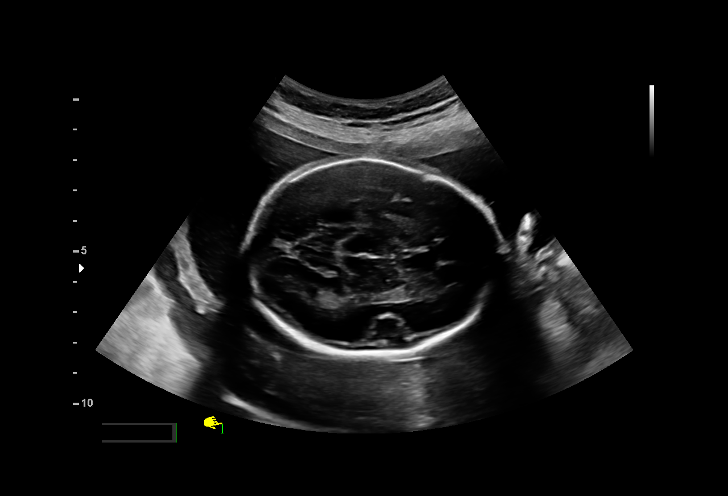
[im 33/60]
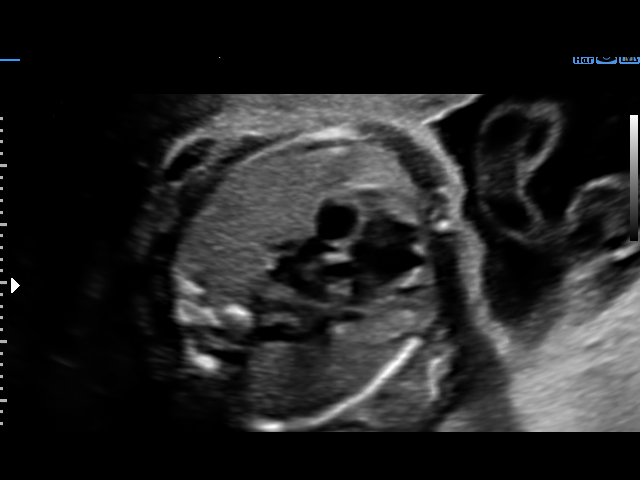
[im 38/60]
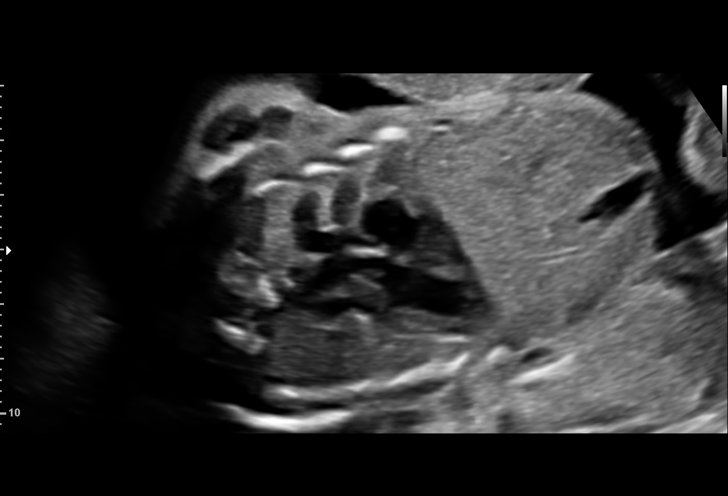
[im 42/60]
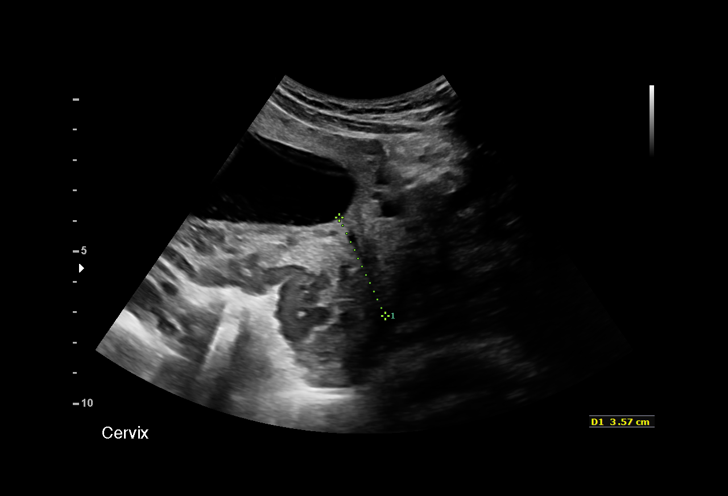
[im 46/60]
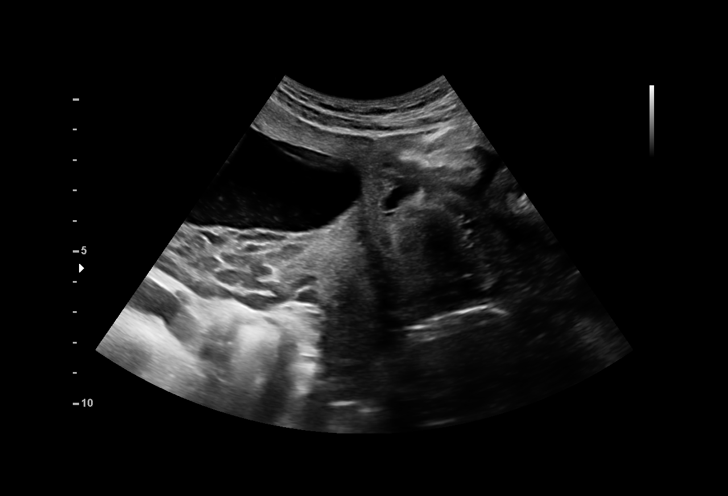
[im 51/60]
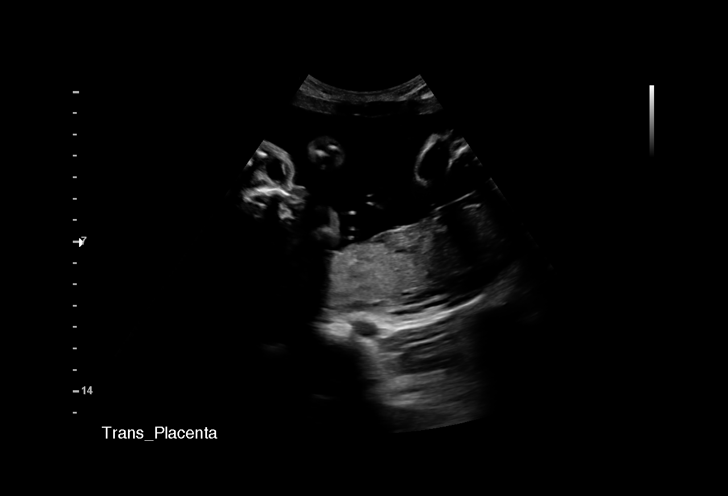
[im 55/60]
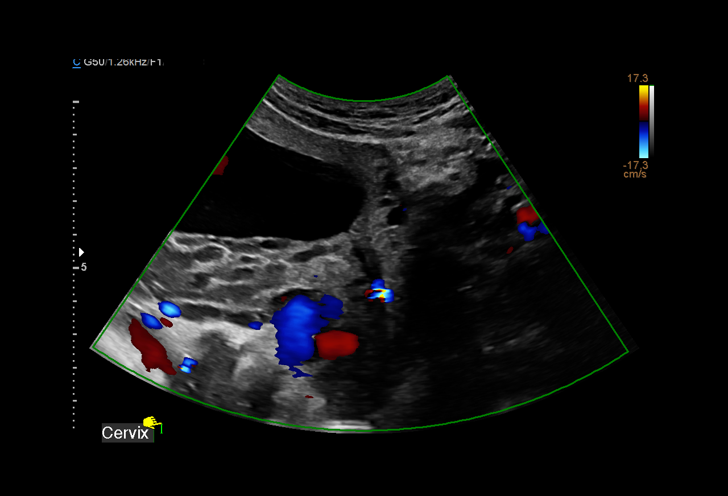
[im 60/60]
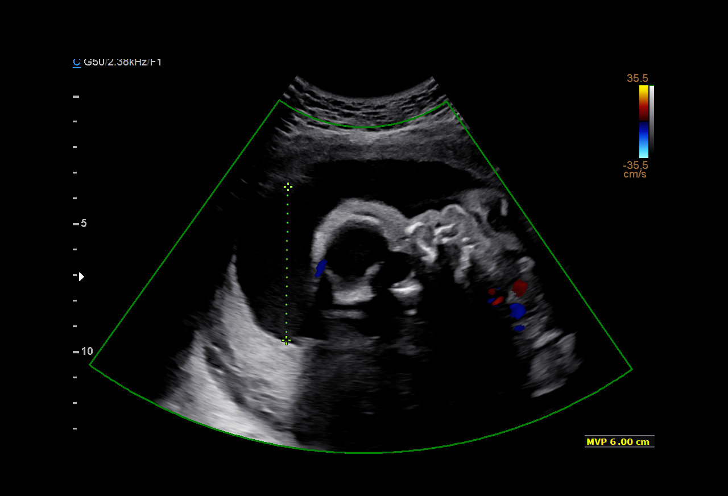

[14 of 28 positions shown; findings below may reference images not displayed]

1  CECELIA BRATTON            [id number]      [id number]     [id number]
Indications

25 weeks gestation of pregnancy
Encounter for other antenatal screening
follow-up
Poor obstetric history: Previous
preeclampsia / eclampsia/gestational HTN
OB History

Gravidity:    4         Term:   2
TOP:          1        Living:  2
Fetal Evaluation

Num Of Fetuses:     1
Fetal Heart         148
Rate(bpm):
Cardiac Activity:   Observed
Presentation:       Transverse, head to maternal right
Placenta:           Posterior, above cervical os
P. Cord Insertion:  Visualized, central

Largest Pocket(cm)
6.0
Biometry

BPD:        63  mm     G. Age:  25w 4d         39  %    CI:        69.86   %   70 - 86
FL/HC:      19.5   %   18.6 -
HC:      240.5  mm     G. Age:  26w 1d         46  %    HC/AC:      1.11       1.04 -
AC:      217.6  mm     G. Age:  26w 2d         62  %    FL/BPD:     74.3   %   71 - 87
FL:       46.8  mm     G. Age:  25w 4d         37  %    FL/AC:      21.5   %   20 - 24
Est. FW:     873  gm    1 lb 15 oz      61  %
Gestational Age

LMP:           25w 4d       Date:   04/12/16                 EDD:   01/17/17
U/S Today:     25w 6d                                        EDD:   01/15/17
Best:          25w 4d    Det. By:   LMP  (04/12/16)          EDD:   01/17/17
Anatomy

Cranium:               Appears normal         Aortic Arch:            Previously seen
Cavum:                 Appears normal         Ductal Arch:            Previously seen
Ventricles:            Appears normal         Diaphragm:              Appears normal
Choroid Plexus:        Previously seen        Stomach:                Appears normal, left
sided
Cerebellum:            Appears normal         Abdomen:                Appears normal
Posterior Fossa:       Previously seen        Abdominal Wall:         Previously seen
Nuchal Fold:           Previously seen        Cord Vessels:           Previously seen
Face:                  Orbits and profile     Kidneys:                Appear normal
previously seen
Lips:                  Appears normal         Bladder:                Appears normal
Thoracic:              Appears normal         Spine:                  Previously seen
Heart:                 Appears normal         Upper Extremities:      Previously seen
(4CH, axis, and
situs)
RVOT:                  Appears normal         Lower Extremities:      Previously seen
LVOT:                  Appears normal

Other:  Male gender. Heels previously seen. Nasal bone visualized.
Technically difficult due to fetal position.
Cervix Uterus Adnexa

Cervix
Length:           3.86  cm.
Normal appearance by transabdominal scan.

Uterus
No abnormality visualized.

Left Ovary
Not visualized.

Right Ovary
Within normal limits.

Adnexa:       No abnormality visualized.
Impression

Single IUP at 25w 4d
Normal interval anatomy
Fetal growth is appropriate (61st %tile)
Posterior placenta without previa
Normal amniotic fluid volume
Recommendations

Follow-up ultrasounds as clinically indicated.

## 2020-06-27 ENCOUNTER — Emergency Department (HOSPITAL_COMMUNITY)
Admission: EM | Admit: 2020-06-27 | Discharge: 2020-06-27 | Disposition: A | Discharge status: Home / Self Care | Payer: BC Managed Care – PPO | Attending: Emergency Medicine | Admitting: Emergency Medicine

## 2020-06-27 ENCOUNTER — Other Ambulatory Visit: Payer: Self-pay

## 2020-06-27 ENCOUNTER — Encounter (HOSPITAL_COMMUNITY): Payer: Self-pay

## 2020-06-27 DIAGNOSIS — Z5321 Procedure and treatment not carried out due to patient leaving prior to being seen by health care provider: Secondary | ICD-10-CM | POA: Insufficient documentation

## 2020-06-27 DIAGNOSIS — K0889 Other specified disorders of teeth and supporting structures: Secondary | ICD-10-CM | POA: Diagnosis present

## 2020-06-27 MED ORDER — ACETAMINOPHEN 500 MG PO TABS
1000.0000 mg | ORAL_TABLET | Freq: Once | ORAL | Status: AC
  Administered 2020-06-27: 1000 mg via ORAL
  Filled 2020-06-27: qty 2

## 2020-06-27 NOTE — ED Notes (Signed)
Pt called no answer 

## 2020-06-27 NOTE — ED Triage Notes (Signed)
Pt reports she is having dental pain on right side x2 days.

## 2020-06-27 NOTE — ED Notes (Signed)
Called to be roomed, no response x1
